# Patient Record
Sex: Female | Born: 1990 | Race: Black or African American | Hispanic: No | Marital: Single | Smoking: Never smoker
Health system: Southern US, Community
[De-identification: ages and names within clinical notes are randomized; demographics above are authoritative.]

## PROBLEM LIST (undated history)

## (undated) DIAGNOSIS — E611 Iron deficiency: Secondary | ICD-10-CM

## (undated) DIAGNOSIS — F329 Major depressive disorder, single episode, unspecified: Secondary | ICD-10-CM

## (undated) DIAGNOSIS — F419 Anxiety disorder, unspecified: Secondary | ICD-10-CM

## (undated) DIAGNOSIS — F32A Depression, unspecified: Secondary | ICD-10-CM

## (undated) DIAGNOSIS — B009 Herpesviral infection, unspecified: Secondary | ICD-10-CM

## (undated) DIAGNOSIS — Z789 Other specified health status: Secondary | ICD-10-CM

## (undated) HISTORY — PX: NO PAST SURGERIES: SHX2092

## (undated) HISTORY — DX: Depression, unspecified: F32.A

## (undated) HISTORY — DX: Herpesviral infection, unspecified: B00.9

## (undated) HISTORY — DX: Anxiety disorder, unspecified: F41.9

---
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
## 2013-12-08 ENCOUNTER — Encounter (HOSPITAL_COMMUNITY): Payer: Self-pay | Admitting: *Deleted

## 2013-12-08 ENCOUNTER — Inpatient Hospital Stay (HOSPITAL_COMMUNITY)
Admission: AD | Admit: 2013-12-08 | Discharge: 2013-12-08 | Disposition: A | Discharge status: Home / Self Care | Payer: Managed Care, Other (non HMO) | Source: Ambulatory Visit | Attending: Obstetrics and Gynecology | Admitting: Obstetrics and Gynecology

## 2013-12-08 ENCOUNTER — Inpatient Hospital Stay (HOSPITAL_COMMUNITY): Payer: Managed Care, Other (non HMO)

## 2013-12-08 DIAGNOSIS — O209 Hemorrhage in early pregnancy, unspecified: Secondary | ICD-10-CM | POA: Insufficient documentation

## 2013-12-08 DIAGNOSIS — O039 Complete or unspecified spontaneous abortion without complication: Secondary | ICD-10-CM | POA: Insufficient documentation

## 2013-12-08 DIAGNOSIS — O4690 Antepartum hemorrhage, unspecified, unspecified trimester: Secondary | ICD-10-CM

## 2013-12-08 HISTORY — DX: Other specified health status: Z78.9

## 2013-12-08 LAB — URINE MICROSCOPIC-ADD ON

## 2013-12-08 LAB — URINALYSIS, ROUTINE W REFLEX MICROSCOPIC
Bilirubin Urine: NEGATIVE
Glucose, UA: NEGATIVE mg/dL
Ketones, ur: NEGATIVE mg/dL
Leukocytes, UA: NEGATIVE
Nitrite: NEGATIVE
Protein, ur: NEGATIVE mg/dL
Specific Gravity, Urine: 1.015 (ref 1.005–1.030)
Urobilinogen, UA: 0.2 mg/dL (ref 0.0–1.0)
pH: 7.5 (ref 5.0–8.0)

## 2013-12-08 LAB — WET PREP, GENITAL
Trich, Wet Prep: NONE SEEN
Yeast Wet Prep HPF POC: NONE SEEN

## 2013-12-08 LAB — CBC
HCT: 36 % (ref 36.0–46.0)
Hemoglobin: 12 g/dL (ref 12.0–15.0)
MCH: 28.4 pg (ref 26.0–34.0)
MCHC: 33.3 g/dL (ref 30.0–36.0)
MCV: 85.1 fL (ref 78.0–100.0)
Platelets: 162 10*3/uL (ref 150–400)
RBC: 4.23 MIL/uL (ref 3.87–5.11)
RDW: 13.1 % (ref 11.5–15.5)
WBC: 5.5 10*3/uL (ref 4.0–10.5)

## 2013-12-08 LAB — HCG, QUANTITATIVE, PREGNANCY: hCG, Beta Chain, Quant, S: 1113 m[IU]/mL — ABNORMAL HIGH (ref <5)

## 2013-12-08 LAB — ABO/RH: ABO/RH(D): B POS

## 2013-12-08 LAB — POCT PREGNANCY, URINE: Preg Test, Ur: POSITIVE — AB

## 2013-12-08 NOTE — Discharge Instructions (Signed)
Vaginal Bleeding During Pregnancy, First Trimester  A small amount of bleeding (spotting) from the vagina is relatively common in early pregnancy. It usually stops on its own. Various things may cause bleeding or spotting in early pregnancy. Some bleeding may be related to the pregnancy, and some may not. In most cases, the bleeding is normal and is not a problem. However, bleeding can also be a sign of something serious. Be sure to tell your health care provider about any vaginal bleeding right away.  Some possible causes of vaginal bleeding during the first trimester include:  · Infection or inflammation of the cervix.  · Growths (polyps) on the cervix.  · Miscarriage or threatened miscarriage.  · Pregnancy tissue has developed outside of the uterus and in a fallopian tube (tubal pregnancy).  · Tiny cysts have developed in the uterus instead of pregnancy tissue (molar pregnancy).  HOME CARE INSTRUCTIONS   Watch your condition for any changes. The following actions may help to lessen any discomfort you are feeling:  · Follow your health care provider's instructions for limiting your activity. If your health care provider orders bed rest, you may need to stay in bed and only get up to use the bathroom. However, your health care provider may allow you to continue light activity.  · If needed, make plans for someone to help with your regular activities and responsibilities while you are on bed rest.  · Keep track of the number of pads you use each day, how often you change pads, and how soaked (saturated) they are. Write this down.  · Do not use tampons. Do not douche.  · Do not have sexual intercourse or orgasms until approved by your health care provider.  · If you pass any tissue from your vagina, save the tissue so you can show it to your health care provider.  · Only take over-the-counter or prescription medicines as directed by your health care provider.  · Do not take aspirin because it can make you  bleed.  · Keep all follow-up appointments as directed by your health care provider.  SEEK MEDICAL CARE IF:  · You have any vaginal bleeding during any part of your pregnancy.  · You have cramps or labor pains.  · You have a fever, not controlled by medicine.  SEEK IMMEDIATE MEDICAL CARE IF:   · You have severe cramps in your back or belly (abdomen).  · You pass large clots or tissue from your vagina.  · Your bleeding increases.  · You feel light-headed or weak, or you have fainting episodes.  · You have chills.  · You are leaking fluid or have a gush of fluid from your vagina.  · You pass out while having a bowel movement.  MAKE SURE YOU:  · Understand these instructions.  · Will watch your condition.  · Will get help right away if you are not doing well or get worse.  Document Released: 12/15/2004 Document Revised: 03/12/2013 Document Reviewed: 11/12/2012  ExitCare® Patient Information ©2015 [employer]. This information is not intended to replace advice given to you by your health care provider. Make sure you discuss any questions you have with your health care provider.

## 2013-12-08 NOTE — MAU Note (Signed)
Pt presents to MAU with complaints of heavy vaginal bleeding that started today while she was at work. She had a + HPT on September the 7th. Reports lower abdominal cramping

## 2013-12-08 NOTE — MAU Provider Note (Signed)
History     CSN: [id number]  Arrival date and time: 12/08/13 1711   First Provider Initiated Contact with Patient 12/08/13 1822      Chief Complaint  Patient presents with  . Vaginal Bleeding   HPI Kayla Murphy is a 23 y.o. G2P0010 with LMP 7/26 making her 8 1/[redacted] wks EGA by dates. She has spotting 8/25 x 2-3 days. First post UPT 9/7, had BHCG's that increased with PCP. She has had low pelvic pressure since 9/14. She started having low abd cramping at 11:30 this am, was at work. Bleeding started after that, passed a large clot in BR after arrived at hospital. She continue to cramp and bleed, not as much. No recent change in discharge, odor, itchig. No UTI S&S or GI changes. OB History   Grav Para Term Preterm Abortions TAB SAB Ect Mult Living        Past Medical History  Diagnosis Date  . Medical history non-contributory     Past Surgical History  Procedure Laterality Date  . No past surgeries      History reviewed. No pertinent family history.  History  Substance Use Topics  . Smoking status: Never Smoker   . Smokeless tobacco: Never Used  . Alcohol Use: Yes     Comment: socially    Allergies:  Allergies  Allergen Reactions  . Shellfish Allergy Anaphylaxis    Prescriptions prior to admission  Medication Sig Dispense Refill  . EPINEPHrine (EPIPEN 2-PAK) 0.3 mg/0.3 mL IJ SOAJ injection Inject 0.3 mg into the muscle once.      Marland Kitchen ibuprofen (ADVIL,MOTRIN) 800 MG tablet Take 800 mg by mouth every 8 (eight) hours as needed for moderate pain.        Review of Systems  Constitutional: Negative for fever and chills.  Gastrointestinal: Positive for abdominal pain. Negative for nausea, vomiting, diarrhea and constipation.  Genitourinary: Negative for dysuria, urgency and frequency.       Vaginal bleeding   Physical Exam   Blood pressure 128/70, pulse 76, temperature 98.7 F (37.1 C), resp. rate 18, height  (1.626 m), weight 79.833 kg (176 lb),  last menstrual period 11/07/2013.  Physical Exam  Nursing note and vitals reviewed. Constitutional: She is oriented to person, place, and time. She appears well-developed and well-nourished.  GI: Soft. There is no tenderness.  Genitourinary:  Pelvic exam: Ext gen- nl anatomy, skin intact Vagina- sm amt bright red blood/clot Cx- sl open Utrus- enlarged 6 wk size Adn- non masses palp, non tender  Musculoskeletal: Normal range of motion.  Neurological: She is alert and oriented to person, place, and time.  Skin: Skin is warm and dry.  Psychiatric: She has a normal mood and affect. Her behavior is normal.    MAU Course  Procedures  MDM Results for orders placed during the hospital encounter of 12/08/13 (from the past 24 hour(s))  URINALYSIS, ROUTINE W REFLEX MICROSCOPIC     Status: Abnormal   Collection Time    12/08/13  5:35 PM      Result Value Ref Range   Color, Urine YELLOW  YELLOW   APPearance CLEAR  CLEAR   Specific Gravity, Urine 1.015  1.005 - 1.030   pH 7.5  5.0 - 8.0   Glucose, UA NEGATIVE  NEGATIVE mg/dL   Hgb urine dipstick LARGE (*) NEGATIVE   Bilirubin Urine NEGATIVE  NEGATIVE   Ketones, ur NEGATIVE  NEGATIVE mg/dL  Protein, ur NEGATIVE  NEGATIVE mg/dL   Urobilinogen, UA 0.2  0.0 - 1.0 mg/dL   Nitrite NEGATIVE  NEGATIVE   Leukocytes, UA NEGATIVE  NEGATIVE  URINE MICROSCOPIC-ADD ON     Status: None   Collection Time    12/08/13  5:35 PM      Result Value Ref Range   Squamous Epithelial / LPF RARE  RARE   WBC, UA 0-2  <3 WBC/hpf   RBC / HPF 21-50  <3 RBC/hpf   Bacteria, UA RARE  RARE  POCT PREGNANCY, URINE     Status: Abnormal   Collection Time    12/08/13  5:49 PM      Result Value Ref Range   Preg Test, Ur POSITIVE (*) NEGATIVE  ABO/RH     Status: None   Collection Time    12/08/13  6:35 PM      Result Value Ref Range   ABO/RH(D) B POS    CBC     Status: None   Collection Time    12/08/13  6:35 PM      Result Value Ref Range   WBC 5.5  4.0 -  10.5 K/uL   RBC 4.23  3.87 - 5.11 MIL/uL   Hemoglobin 12.0  12.0 - 15.0 g/dL   HCT 16.1  09.6 - 04.5 %   MCV 85.1  78.0 - 100.0 fL   MCH 28.4  26.0 - 34.0 pg   MCHC 33.3  30.0 - 36.0 g/dL   RDW 40.9  81.1 - 91.4 %   Platelets 162  150 - 400 K/uL  HCG, QUANTITATIVE, PREGNANCY     Status: Abnormal   Collection Time    12/08/13  6:35 PM      Result Value Ref Range   hCG, Beta Chain, Quant, S 1113 (*) <5 mIU/mL  WET PREP, GENITAL     Status: Abnormal   Collection Time    12/08/13  6:36 PM      Result Value Ref Range   Yeast Wet Prep HPF POC NONE SEEN  NONE SEEN   Trich, Wet Prep NONE SEEN  NONE SEEN   Clue Cells Wet Prep HPF POC FEW (*) NONE SEEN   WBC, Wet Prep HPF POC FEW (*) NONE SEEN   US Ob Comp Less 14 Wks  12/08/2013   CLINICAL DATA:  Heavy vaginal bleeding and pelvic cramping. Pregnant. Beta HCG level 1,113.  EXAM: OBSTETRIC <14 WK [place] AND TRANSVAGINAL OB US  TECHNIQUE: Both transabdominal and transvaginal ultrasound examinations were performed for complete evaluation of the gestation as well as the maternal uterus, adnexal regions, and pelvic cul-de-sac. Transvaginal technique was performed to assess early pregnancy.  COMPARISON:  None.  FINDINGS: Intrauterine gestational sac: Oval gestational sac lies in the lower uterine segment near the endocervix.  Yolk sac:  Yes  Embryo: Focal area of soft tissue lies along side the yolk sac which may reflect an embryo.  Cardiac Activity: No  MSD:  7.8 mm  mm   5 w   3  d  CRL: Possible embryo. 4.7 mm 6 w 2 d [place] EDC: 08/01/2014 based on the possible embryo.  Maternal uterus/adnexae: No subchronic hemorrhage. No uterine mass. Cervix is closed. Ovaries are unremarkable. No free fluid.  IMPRESSION: 1. Gestational sac containing a yolk sac and a possible embryo lies in the lower uterine segment adjacent to the endocervix, can abnormally low position. No fetal heart activity could be detected. This may reflect an  early intrauterine pregnancy. It may  reflect a failed intrauterine pregnancy. Findings are suspicious but not yet definitive for failed pregnancy. Recommend follow-up US in 10-14 days for definitive diagnosis. This recommendation follows SRU consensus guidelines: Diagnostic Criteria for Nonviable Pregnancy Early in the First Trimester. Malva Limes Med 2013; 956:2130-86. 2. No subchronic hemorrhage were uterine abnormality. Cervix is closed. Normal adnexae and ovaries.   Electronically Signed   By: Amie Portland M.D.   On: 12/08/2013 20:05   US Ob Transvaginal  12/08/2013   CLINICAL DATA:  Heavy vaginal bleeding and pelvic cramping. Pregnant. Beta HCG level 1,113.  EXAM: OBSTETRIC <14 WK [place] AND TRANSVAGINAL OB US  TECHNIQUE: Both transabdominal and transvaginal ultrasound examinations were performed for complete evaluation of the gestation as well as the maternal uterus, adnexal regions, and pelvic cul-de-sac. Transvaginal technique was performed to assess early pregnancy.  COMPARISON:  None.  FINDINGS: Intrauterine gestational sac: Oval gestational sac lies in the lower uterine segment near the endocervix.  Yolk sac:  Yes  Embryo: Focal area of soft tissue lies along side the yolk sac which may reflect an embryo.  Cardiac Activity: No  MSD:  7.8 mm  mm   5 w   3  d  CRL: Possible embryo. 4.7 mm 6 w 2 d [place] EDC: 08/01/2014 based on the possible embryo.  Maternal uterus/adnexae: No subchronic hemorrhage. No uterine mass. Cervix is closed. Ovaries are unremarkable. No free fluid.  IMPRESSION: 1. Gestational sac containing a yolk sac and a possible embryo lies in the lower uterine segment adjacent to the endocervix, can abnormally low position. No fetal heart activity could be detected. This may reflect an early intrauterine pregnancy. It may reflect a failed intrauterine pregnancy. Findings are suspicious but not yet definitive for failed pregnancy. Recommend follow-up US in 10-14 days for definitive diagnosis. This recommendation follows SRU consensus  guidelines: Diagnostic Criteria for Nonviable Pregnancy Early in the First Trimester. Malva Limes Med 2013; 578:4696-29. 2. No subchronic hemorrhage were uterine abnormality. Cervix is closed. Normal adnexae and ovaries.   Electronically Signed   By: Amie Portland M.D.   On: 12/08/2013 20:05     Assessment and Plan  Early failed pregnancy vs early pregnancy Options reviewed with the pt, she will return in 48 hr for repeat BHCG Course for normal miscarriage reviewed Return sooner with heavy bleeding, fever >100.5 or dizziness/fainting Cancelled pathology on blood clot/tissue pt passed in toilet  Zylpha Poynor M. 12/08/2013, 6:42 PM

## 2013-12-09 LAB — HIV ANTIBODY (ROUTINE TESTING W REFLEX): HIV 1&2 Ab, 4th Generation: NONREACTIVE

## 2013-12-09 NOTE — MAU Provider Note (Signed)
Attestation of Attending Supervision of Advanced Practitioner (CNM/NP): Evaluation and management procedures were performed by the Advanced Practitioner under my supervision and collaboration.  I have reviewed the Advanced Practitioner's note and chart, and I agree with the management and plan.  Labron Bloodgood 12/09/2013 8:13 AM   

## 2013-12-10 ENCOUNTER — Inpatient Hospital Stay (HOSPITAL_COMMUNITY)
Admission: AD | Admit: 2013-12-10 | Discharge: 2013-12-10 | Disposition: A | Discharge status: Home / Self Care | Payer: Managed Care, Other (non HMO) | Source: Ambulatory Visit | Attending: Obstetrics & Gynecology | Admitting: Obstetrics & Gynecology

## 2013-12-10 ENCOUNTER — Encounter (HOSPITAL_COMMUNITY): Payer: Self-pay

## 2013-12-10 ENCOUNTER — Telehealth: Payer: Self-pay

## 2013-12-10 DIAGNOSIS — O034 Incomplete spontaneous abortion without complication: Secondary | ICD-10-CM | POA: Diagnosis not present

## 2013-12-10 DIAGNOSIS — A749 Chlamydial infection, unspecified: Secondary | ICD-10-CM

## 2013-12-10 DIAGNOSIS — O039 Complete or unspecified spontaneous abortion without complication: Secondary | ICD-10-CM

## 2013-12-10 DIAGNOSIS — O209 Hemorrhage in early pregnancy, unspecified: Secondary | ICD-10-CM | POA: Diagnosis present

## 2013-12-10 LAB — GC/CHLAMYDIA PROBE AMP
CT Probe RNA: POSITIVE — AB
GC Probe RNA: NEGATIVE

## 2013-12-10 LAB — HCG, QUANTITATIVE, PREGNANCY: hCG, Beta Chain, Quant, S: 1012 m[IU]/mL — ABNORMAL HIGH (ref <5)

## 2013-12-10 MED ORDER — PROMETHAZINE HCL 25 MG PO TABS: 25.0000 mg | ORAL_TABLET | Freq: Four times a day (QID) | ORAL | Status: DC | PRN

## 2013-12-10 MED ORDER — IBUPROFEN 800 MG PO TABS: 800.0000 mg | ORAL_TABLET | Freq: Three times a day (TID) | ORAL | Status: DC

## 2013-12-10 MED ORDER — AZITHROMYCIN 500 MG PO TABS: 1000.0000 mg | ORAL_TABLET | Freq: Once | ORAL | Status: DC

## 2013-12-10 MED ORDER — MISOPROSTOL 200 MCG PO TABS: 800.0000 ug | ORAL_TABLET | Freq: Once | ORAL | Status: DC

## 2013-12-10 MED ORDER — ONDANSETRON 8 MG PO TBDP: 8.0000 mg | ORAL_TABLET | Freq: Three times a day (TID) | ORAL | Status: DC | PRN

## 2013-12-10 NOTE — MAU Provider Note (Signed)
History     CSN: [id number]  Arrival date and time: 12/10/13 1623   None     Chief Complaint  Patient presents with  . Follow-up   HPIpt is G3P0010 at [redacted]w[redacted]d pregnant for f/u HCG.  Pt was seen on 9/20 with spotting in pregnancy.  Pt had a positive UPT 11/25/2013 and had rising HCGs with PCP then started having low abd pressure on 9/14 and cramping on 9/20 followed by vaginal bleeding with large clot.  Pt's HCG on 9/20  Was 1113 with ultrasound showing IUGS with YS and possible embryo in lower uterine segment. Pt was told this was likely a failed pregnancy. Pt is in school and also works. Pt continues to have abd cramping and mod bleeding, but no passage of tissue or more clots.  RN note: Patient to MAU for repeat BHCG. Patient states she is having mild abdominal and back pain and patient states moderate bleeding.      Past Medical History  Diagnosis Date  . Medical history non-contributory     Past Surgical History  Procedure Laterality Date  . No past surgeries      No family history on file.  History  Substance Use Topics  . Smoking status: Never Smoker   . Smokeless tobacco: Never Used  . Alcohol Use: Yes     Comment: socially    Allergies:  Allergies  Allergen Reactions  . Shellfish Allergy Anaphylaxis    Prescriptions prior to admission  Medication Sig Dispense Refill  . azithromycin (ZITHROMAX) 500 MG tablet Take 2 tablets (1,000 mg total) by mouth once.  2 tablet  0  . EPINEPHrine (EPIPEN 2-PAK) 0.3 mg/0.3 mL IJ SOAJ injection Inject 0.3 mg into the muscle once.        Review of Systems  Constitutional: Positive for malaise/fatigue. Negative for fever and chills.  Gastrointestinal: Positive for nausea and abdominal pain. Negative for vomiting, diarrhea and constipation.  Genitourinary: Negative for dysuria.   Physical Exam   Blood pressure 113/63, pulse 67, temperature 99.3 F (37.4 C), resp. rate 16, last menstrual period 11/07/2013, SpO2  100.00%.  Physical Exam  Nursing note and vitals reviewed. Constitutional: She is oriented to person, place, and time. She appears well-developed and well-nourished. No distress.  HENT:  Head: Normocephalic.  Eyes: Pupils are equal, round, and reactive to light.  Neck: Normal range of motion. Neck supple.  Cardiovascular: Normal rate.   Respiratory: Effort normal.  GI: Soft.  Musculoskeletal: Normal range of motion.  Neurological: She is alert and oriented to person, place, and time.  Skin: Skin is warm and dry.  Psychiatric: She has a normal mood and affect.    MAU Course  Procedures Results for orders placed during the hospital encounter of 12/10/13 (from the past 24 hour(s))  HCG, QUANTITATIVE, PREGNANCY     Status: Abnormal   Collection Time    12/10/13  4:57 PM      Result Value Ref Range   hCG, Beta Chain, Quant, S 1012 (*) <5 mIU/mL  discussed with pt failed pregnancy with dropping HCG Discussed options with pt including expectant management and Cytotec Pt would like to proceed with Cytotec due to unpredictable prolonged bleeding and cramping Directions with side effects and bleeding discussed with pt- written instructions given Pt's Blood Type is B POS Hemoglobin 12 gm on 12/08/2012  Assessment and Plan  Incomplete miscarriage cytotec 800 mcg - phenergan  Rx and Ibuprofen  Rx. F/u in 2 weeks with clinic- message  sent to [facility], to make appointment  [facility] 12/10/2013, 5:43 PM

## 2013-12-10 NOTE — Telephone Encounter (Signed)
Called patient and informed her of results and recommendations. Zithromax 1gm e-prescribed to pharmacy. Informed her that she should abstain from sex for at least 7 days after both she and partner are treated. Patient verbalized understanding and gratitude. No questions or concers. STD card faxed to [facility].

## 2013-12-10 NOTE — Telephone Encounter (Signed)
Message copied by Laraine Samet M on Tue Dec 10, 2013  9:07 AM ------      Message from: CONSTANT, PEGGY      Created: Tue Dec 10, 2013  8:50 AM       Please inform patient of positive chlamydia infection. Please call in Azithromycin 1 gm po x 1 for her and inform her to have partner treated as well            Thanks            Peggy ------ 

## 2013-12-10 NOTE — Telephone Encounter (Signed)
Message copied by Louanna Raw on Tue Dec 10, 2013  9:07 AM ------      Message from: Catalina Antigua      Created: Tue Dec 10, 2013  8:50 AM       Please inform patient of positive chlamydia infection. Please call in Azithromycin 1 gm po x 1 for her and inform her to have partner treated as well            Thanks            Peggy ------

## 2013-12-10 NOTE — MAU Note (Signed)
Patient to MAU for repeat BHCG. Patient states she is having mild abdominal and back pain and patient states moderate bleeding.

## 2013-12-25 ENCOUNTER — Ambulatory Visit (INDEPENDENT_AMBULATORY_CARE_PROVIDER_SITE_OTHER): Payer: Managed Care, Other (non HMO) | Admitting: Obstetrics and Gynecology

## 2013-12-25 ENCOUNTER — Encounter: Payer: Self-pay | Admitting: Obstetrics and Gynecology

## 2013-12-25 VITALS — BP 106/63 | HR 69 | Temp 98.3°F | Ht 65.0 in | Wt 172.6 lb

## 2013-12-25 DIAGNOSIS — O039 Complete or unspecified spontaneous abortion without complication: Secondary | ICD-10-CM

## 2013-12-25 DIAGNOSIS — Z3009 Encounter for other general counseling and advice on contraception: Secondary | ICD-10-CM

## 2013-12-25 NOTE — Progress Notes (Signed)
Patient ID: Kayla Murphy, female   DOB: 1991-01-15, 23 y.o.   MRN: [id number] 23 yo G3P0030 presenting as an MAU follow up s/p medical management of a spontaneous abortion with cytotec. Patient reports vaginal bleeding for approximately 10 days with initial passage of large blood clots. Patient denies CP, SOB, lightheadness/dizziness, nausea or emesis. This was an unplanned pregnancy and the patient is interested in contraception  Past Medical History  Diagnosis Date  . Medical history non-contributory    Past Surgical History  Procedure Laterality Date  . No past surgeries     No family history on file.  History  Substance Use Topics  . Smoking status: Never Smoker   . Smokeless tobacco: Never Used  . Alcohol Use: Yes     Comment: socially   Physical exam Not indicated  A/P 23 yo s/p cytotec administration on 9/22 for SAB - Will repeat quant HCG - Extensive contraception counseling. After careful consideration, patient is interested in Nexplanon - Patient advised to abstain from unprotected intercourse until Nexplanon placement - RTC in 1 week for Nexplanon insertion or prn

## 2013-12-25 NOTE — Patient Instructions (Signed)
Etonogestrel implant What is this medicine? ETONOGESTREL (et oh noe JES trel) is a contraceptive (birth control) device. It is used to prevent pregnancy. It can be used for up to 3 years. This medicine may be used for other purposes; ask your health care provider or pharmacist if you have questions. COMMON BRAND NAME(S): Implanon, Nexplanon What should I tell my health care provider before I take this medicine? They need to know if you have any of these conditions: -abnormal vaginal bleeding -blood vessel disease or blood clots -cancer of the breast, cervix, or liver -depression -diabetes -gallbladder disease -headaches -heart disease or recent heart attack -high blood pressure -high cholesterol -kidney disease -liver disease -renal disease -seizures -tobacco smoker -an unusual or allergic reaction to etonogestrel, other hormones, anesthetics or antiseptics, medicines, foods, dyes, or preservatives -pregnant or trying to get pregnant -breast-feeding How should I use this medicine? This device is inserted just under the skin on the inner side of your upper arm by a health care professional. Talk to your pediatrician regarding the use of this medicine in children. Special care may be needed. Overdosage: If you think you've taken too much of this medicine contact a poison control center or emergency room at once. Overdosage: If you think you have taken too much of this medicine contact a poison control center or emergency room at once. NOTE: This medicine is only for you. Do not share this medicine with others. What if I miss a dose? This does not apply. What may interact with this medicine? Do not take this medicine with any of the following medications: -amprenavir -bosentan -fosamprenavir This medicine may also interact with the following medications: -barbiturate medicines for inducing sleep or treating seizures -certain medicines for fungal infections like ketoconazole and  itraconazole -griseofulvin -medicines to treat seizures like carbamazepine, felbamate, oxcarbazepine, phenytoin, topiramate -modafinil -phenylbutazone -rifampin -some medicines to treat HIV infection like atazanavir, indinavir, lopinavir, nelfinavir, tipranavir, ritonavir -St. John's wort This list may not describe all possible interactions. Give your health care provider a list of all the medicines, herbs, non-prescription drugs, or dietary supplements you use. Also tell them if you smoke, drink alcohol, or use illegal drugs. Some items may interact with your medicine. What should I watch for while using this medicine? This product does not protect you against HIV infection (AIDS) or other sexually transmitted diseases. You should be able to feel the implant by pressing your fingertips over the skin where it was inserted. Tell your doctor if you cannot feel the implant. What side effects may I notice from receiving this medicine? Side effects that you should report to your doctor or health care professional as soon as possible: -allergic reactions like skin rash, itching or hives, swelling of the face, lips, or tongue -breast lumps -changes in vision -confusion, trouble speaking or understanding -dark urine -depressed mood -general ill feeling or flu-like symptoms -light-colored stools -loss of appetite, nausea -right upper belly pain -severe headaches -severe pain, swelling, or tenderness in the abdomen -shortness of breath, chest pain, swelling in a leg -signs of pregnancy -sudden numbness or weakness of the face, arm or leg -trouble walking, dizziness, loss of balance or coordination -unusual vaginal bleeding, discharge -unusually weak or tired -yellowing of the eyes or skin Side effects that usually do not require medical attention (Report these to your doctor or health care professional if they continue or are bothersome.): -acne -breast pain -changes in  weight -cough -fever or chills -headache -irregular menstrual bleeding -itching, burning, and   vaginal discharge -pain or difficulty passing urine -sore throat This list may not describe all possible side effects. Call your doctor for medical advice about side effects. You may report side effects to FDA at 1-800-FDA-1088. Where should I keep my medicine? This drug is given in a hospital or clinic and will not be stored at home. NOTE: This sheet is a summary. It may not cover all possible information. If you have questions about this medicine, talk to your doctor, pharmacist, or health care provider.  2015, Elsevier/Gold Standard. (2011-09-12 15:37:45)  

## 2013-12-26 LAB — HCG, QUANTITATIVE, PREGNANCY: hCG, Beta Chain, Quant, S: <2 m[IU]/mL

## 2013-12-27 ENCOUNTER — Telehealth: Payer: Self-pay

## 2013-12-27 NOTE — Telephone Encounter (Signed)
Message copied by Louanna RawAMPBELL, Marua Qin M on Fri Dec 27, 2013 11:54 AM ------      Message from: Catalina AntiguaONSTANT, PEGGY      Created: Fri Dec 27, 2013  8:26 AM       Please inform patient of complete resolution of pregnancy. Patient should abstain or use condoms until Nexplanon insertion ------

## 2013-12-27 NOTE — Telephone Encounter (Signed)
Called patient and informed her of results and recommendations. Patient verbalized understanding and gratitude. No questions or concerns.  

## 2014-01-21 ENCOUNTER — Encounter: Payer: Self-pay | Admitting: Obstetrics and Gynecology

## 2014-01-31 ENCOUNTER — Ambulatory Visit: Payer: Managed Care, Other (non HMO) | Admitting: Family Medicine

## 2014-04-15 ENCOUNTER — Encounter (HOSPITAL_COMMUNITY): Payer: Self-pay | Admitting: Emergency Medicine

## 2014-04-15 ENCOUNTER — Emergency Department (INDEPENDENT_AMBULATORY_CARE_PROVIDER_SITE_OTHER)
Admission: EM | Admit: 2014-04-15 | Discharge: 2014-04-15 | Disposition: A | Discharge status: Home / Self Care | Payer: Self-pay | Source: Home / Self Care | Attending: Family Medicine | Admitting: Family Medicine

## 2014-04-15 ENCOUNTER — Emergency Department (INDEPENDENT_AMBULATORY_CARE_PROVIDER_SITE_OTHER): Payer: Managed Care, Other (non HMO)

## 2014-04-15 DIAGNOSIS — M791 Myalgia: Secondary | ICD-10-CM

## 2014-04-15 DIAGNOSIS — M7918 Myalgia, other site: Secondary | ICD-10-CM

## 2014-04-15 MED ORDER — DICLOFENAC SODIUM 75 MG PO TBEC: 75.0000 mg | DELAYED_RELEASE_TABLET | Freq: Two times a day (BID) | ORAL | Status: DC

## 2014-04-15 MED ORDER — METHOCARBAMOL 500 MG PO TABS: 500.0000 mg | ORAL_TABLET | Freq: Four times a day (QID) | ORAL | Status: DC | PRN
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
## 2018-03-14 ENCOUNTER — Other Ambulatory Visit: Payer: Self-pay

## 2018-03-14 ENCOUNTER — Emergency Department (HOSPITAL_COMMUNITY)
Admission: EM | Admit: 2018-03-14 | Discharge: 2018-03-14 | Disposition: A | Discharge status: Home / Self Care | Payer: Self-pay | Attending: Emergency Medicine | Admitting: Emergency Medicine

## 2018-03-14 ENCOUNTER — Emergency Department (HOSPITAL_COMMUNITY): Payer: Self-pay

## 2018-03-14 ENCOUNTER — Encounter (HOSPITAL_COMMUNITY): Payer: Self-pay | Admitting: Emergency Medicine

## 2018-03-14 DIAGNOSIS — J101 Influenza due to other identified influenza virus with other respiratory manifestations: Secondary | ICD-10-CM

## 2018-03-14 DIAGNOSIS — J111 Influenza due to unidentified influenza virus with other respiratory manifestations: Secondary | ICD-10-CM | POA: Insufficient documentation

## 2018-03-14 LAB — CBC WITH DIFFERENTIAL/PLATELET
Abs Immature Granulocytes: 0.01 10*3/uL (ref 0.00–0.07)
Basophils Absolute: 0 10*3/uL (ref 0.0–0.1)
Basophils Relative: 0 %
Eosinophils Absolute: 0 10*3/uL (ref 0.0–0.5)
Eosinophils Relative: 1 %
HCT: 40.7 % (ref 36.0–46.0)
Hemoglobin: 12.6 g/dL (ref 12.0–15.0)
Immature Granulocytes: 0 %
Lymphocytes Relative: 24 %
Lymphs Abs: 0.7 10*3/uL (ref 0.7–4.0)
MCH: 26.4 pg (ref 26.0–34.0)
MCHC: 31 g/dL (ref 30.0–36.0)
MCV: 85.3 fL (ref 80.0–100.0)
Monocytes Absolute: 0.4 10*3/uL (ref 0.1–1.0)
Monocytes Relative: 14 %
Neutro Abs: 1.8 10*3/uL (ref 1.7–7.7)
Neutrophils Relative %: 61 %
Platelets: 124 10*3/uL — ABNORMAL LOW (ref 150–400)
RBC: 4.77 MIL/uL (ref 3.87–5.11)
RDW: 13.2 % (ref 11.5–15.5)
WBC: 3 10*3/uL — ABNORMAL LOW (ref 4.0–10.5)
nRBC: 0 % (ref 0.0–0.2)

## 2018-03-14 LAB — URINALYSIS, ROUTINE W REFLEX MICROSCOPIC
Bacteria, UA: NONE SEEN
Bilirubin Urine: NEGATIVE
Glucose, UA: NEGATIVE mg/dL
Hgb urine dipstick: NEGATIVE
Ketones, ur: 5 mg/dL — AB
Nitrite: NEGATIVE
Protein, ur: NEGATIVE mg/dL
Specific Gravity, Urine: 1.021 (ref 1.005–1.030)
pH: 5 (ref 5.0–8.0)

## 2018-03-14 LAB — PREGNANCY, URINE: Preg Test, Ur: NEGATIVE

## 2018-03-14 LAB — BASIC METABOLIC PANEL
Anion gap: 10 (ref 5–15)
BUN: 6 mg/dL (ref 6–20)
CO2: 20 mmol/L — ABNORMAL LOW (ref 22–32)
Calcium: 9 mg/dL (ref 8.9–10.3)
Chloride: 106 mmol/L (ref 98–111)
Creatinine, Ser: 0.85 mg/dL (ref 0.44–1.00)
GFR calc Af Amer: >60 mL/min (ref >60)
GFR calc non Af Amer: >60 mL/min (ref >60)
Glucose, Bld: 80 mg/dL (ref 70–99)
Potassium: 3.5 mmol/L (ref 3.5–5.1)
Sodium: 136 mmol/L (ref 135–145)

## 2018-03-14 LAB — INFLUENZA PANEL BY PCR (TYPE A & B)
Influenza A By PCR: NEGATIVE
Influenza B By PCR: POSITIVE — AB

## 2018-03-14 MED ORDER — KETOROLAC TROMETHAMINE 15 MG/ML IJ SOLN
15.0000 mg | Freq: Once | INTRAMUSCULAR | Status: AC
  Administered 2018-03-14: 15 mg via INTRAVENOUS
  Filled 2018-03-14: qty 1

## 2018-03-14 MED ORDER — SODIUM CHLORIDE 0.9 % IV BOLUS
1000.0000 mL | Freq: Once | INTRAVENOUS | Status: AC
  Administered 2018-03-14: 1000 mL via INTRAVENOUS

## 2018-03-14 NOTE — ED Triage Notes (Signed)
Patient from home, since Monday patient started having fevers of 100.9, fever got up to 102 today. Patient having n/v, denies diarrhea. Also reports she has been "peeing on herself" which is not normal for her. Patient has been taking Equate multi symptom and tylenol extra strength PTA.

## 2018-03-14 NOTE — ED Notes (Signed)
IV team at bedside 

## 2018-03-14 NOTE — Discharge Instructions (Signed)
Please rest and drink plenty of fluids. Take Tylenol or Motrin for pain/fever Return to the ED for worsening symptoms

## 2018-03-14 NOTE — ED Notes (Signed)
Pt back from X-ray.  

## 2018-03-14 NOTE — ED Provider Notes (Signed)
[facility] Provider Note   CSN: [id number] Arrival date & time: 03/14/18  1641     History   Chief Complaint Chief Complaint  Patient presents with  . Fever    HPI Kayla Murphy is a 27 y.o. female who presents with fever, URI symptoms, vomiting.  No significant past medical history.  The patient states that for the past 3 days she has had intermittent fevers, congestion, generalized body aches, headache, sore throat, runny nose, coughing.  She also has chest tightness and SOB. Yesterday she had several episodes of nausea and vomiting.  She has not had any any of that today.  She denies abdominal pain, diarrhea, constipation, urinary symptoms.  She does note that she urinated on herself after coughing.  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  She is not had a flu shot this year.  HPI  Past Medical History:  Diagnosis Date  . Medical history non-contributory     There are no active problems to display for this patient.   Past Surgical History:  Procedure Laterality Date  . NO PAST SURGERIES       OB History    Gravida  3   Para  0   Term  0   Preterm  0   AB  2   Living  0     SAB  1   TAB  1   Ectopic  0   Multiple  0   Live Births               Home Medications    Prior to Admission medications   Medication Sig Start Date End Date Taking? Authorizing Provider  acetaminophen (TYLENOL) 500 MG tablet Take 500 mg by mouth every 6 (six) hours as needed for mild pain or fever.   Yes [provider]  Acetaminophen-DM (COLD & COUGH DAYTIME PO) Take 2 tablets by mouth every 8 (eight) hours as needed (cold and cough).   Yes [provider]  EPINEPHrine (EPIPEN 2-PAK) 0.3 mg/0.3 mL IJ SOAJ injection Inject 0.3 mg into the muscle once.    [provider]    Family History History reviewed. No pertinent family history.  Social History Social History   Tobacco  Use  . Smoking status: Never Smoker  . Smokeless tobacco: Never Used  Substance Use Topics  . Alcohol use: Yes    Comment: socially  . Drug use: No     Allergies   Shellfish allergy   Review of Systems Review of Systems  Constitutional: Positive for fatigue and fever.  HENT: Positive for congestion, rhinorrhea and sore throat. Negative for ear pain and trouble swallowing.   Respiratory: Positive for cough, chest tightness and shortness of breath.   Cardiovascular: Negative for chest pain.  Gastrointestinal: Positive for nausea and vomiting. Negative for abdominal pain, constipation and diarrhea.  Genitourinary: Negative for difficulty urinating, dysuria, frequency and pelvic pain.  Neurological: Positive for headaches.  All other systems reviewed and are negative.    Physical Exam Updated Vital Signs BP 91/69 (BP Location: Right Arm)   Pulse 90   Temp 98.7 F (37.1 C) (Oral)   Resp 18   Ht 5\' 6"  (1.676 m)   Wt 69.9 kg   LMP 02/21/2018 (LMP Unknown)   SpO2 100%   BMI 24.86 kg/m   Physical Exam Vitals signs and nursing note reviewed.  Constitutional:      General: She  is not in acute distress.    Appearance: Normal appearance. She is well-developed.     Comments: Calm, cooperative. Fatigued appearing  HENT:     Head: Normocephalic and atraumatic.     Right Ear: Hearing, tympanic membrane, ear canal and external ear normal.     Left Ear: Hearing, tympanic membrane, ear canal and external ear normal.     Nose: Nose normal.     Mouth/Throat:     Lips: Pink.     Mouth: Mucous membranes are moist.     Pharynx: Oropharynx is clear.  Eyes:     General: No scleral icterus.       Right eye: No discharge.        Left eye: No discharge.     Conjunctiva/sclera: Conjunctivae normal.     Pupils: Pupils are equal, round, and reactive to light.  Neck:     Musculoskeletal: Normal range of motion.  Cardiovascular:     Rate and Rhythm: Regular rhythm. Tachycardia present.    Pulmonary:     Effort: Pulmonary effort is normal. No respiratory distress.     Breath sounds: Normal breath sounds.  Abdominal:     General: Abdomen is flat. There is no distension.     Palpations: Abdomen is soft.     Tenderness: There is no abdominal tenderness.  Skin:    General: Skin is warm and dry.  Neurological:     Mental Status: She is alert and oriented to person, place, and time.  Psychiatric:        Behavior: Behavior normal.      ED Treatments / Results  Labs (all labs ordered are listed, but only abnormal results are displayed) Labs Reviewed  URINALYSIS, ROUTINE W REFLEX MICROSCOPIC - Abnormal; Notable for the following components:      Result Value   Ketones, ur 5 (*)    Leukocytes, UA TRACE (*)    All other components within normal limits  BASIC METABOLIC PANEL - Abnormal; Notable for the following components:   CO2 20 (*)    All other components within normal limits  CBC WITH DIFFERENTIAL/PLATELET - Abnormal; Notable for the following components:   WBC 3.0 (*)    Platelets 124 (*)    All other components within normal limits  INFLUENZA PANEL BY PCR (TYPE A & B) - Abnormal; Notable for the following components:   Influenza B By PCR POSITIVE (*)    All other components within normal limits  PREGNANCY, URINE    EKG None  Radiology Dg Chest 2 View  Result Date: 03/14/2018 CLINICAL DATA:  Cough and fever for several days. EXAM: CHEST - 2 VIEW COMPARISON:  None. FINDINGS: The heart size and mediastinal contours are within normal limits. Both lungs are clear. The visualized skeletal structures are unremarkable. IMPRESSION: Negative no active cardiopulmonary disease. Electronically Signed   By: Myles Rosenthal M.D.   On: 03/14/2018 17:57    Procedures Procedures (including critical care time)  Medications Ordered in ED Medications  sodium chloride 0.9 % bolus 1,000 mL (0 mLs Intravenous Stopped 03/14/18 2049)  ketorolac (TORADOL) 15 MG/ML injection 15 mg  (15 mg Intravenous Given 03/14/18 1914)     Initial Impression / Assessment and Plan / ED Course  I have reviewed the triage vital signs and the nursing notes.  Pertinent labs & imaging results that were available during my care of the patient were reviewed by me and considered in my medical decision making (see chart for  details).  27 year old female presents with URI symptoms with vomiting.  Her vital signs are normal.  She is mildly ill-appearing.  She has multiple symptoms and also reports chest pain, vomiting, and an episode of urinary incontinence.  Will obtain blood work, chest x-ray, UA and flu swab.  Will give fluids and Toradol.  Chest x-ray is negative.  Flu is positive for influenza B.  The rest of the blood work is unremarkable.  She was advised to rest, hydrate and to take over-the-counter medicines.  She was advised to return if worsening.  Final Clinical Impressions(s) / ED Diagnoses   Final diagnoses:  Influenza B    ED Discharge Orders    None       Bethel BornGekas, Parker Sawatzky Marie, PA-C 03/14/18 2058    Tilden Fossaees, Elizabeth, MD 03/15/18 [phone]

## 2018-03-14 NOTE — ED Notes (Addendum)
Unsuccessful IV attempt.One lavender tube collected. IV consult placed.  Pt reminded of the need for urine.

## 2018-06-16 ENCOUNTER — Other Ambulatory Visit: Payer: Self-pay

## 2018-06-16 ENCOUNTER — Emergency Department (HOSPITAL_COMMUNITY)
Admission: EM | Admit: 2018-06-16 | Discharge: 2018-06-17 | Disposition: A | Discharge status: Home / Self Care | Payer: Self-pay | Attending: Emergency Medicine | Admitting: Emergency Medicine

## 2018-06-16 DIAGNOSIS — R1084 Generalized abdominal pain: Secondary | ICD-10-CM

## 2018-06-16 DIAGNOSIS — R112 Nausea with vomiting, unspecified: Secondary | ICD-10-CM

## 2018-06-16 DIAGNOSIS — R1031 Right lower quadrant pain: Secondary | ICD-10-CM | POA: Insufficient documentation

## 2018-06-16 LAB — URINALYSIS, ROUTINE W REFLEX MICROSCOPIC
Bacteria, UA: NONE SEEN
Bilirubin Urine: NEGATIVE
Glucose, UA: NEGATIVE mg/dL
Hgb urine dipstick: NEGATIVE
Ketones, ur: NEGATIVE mg/dL
Nitrite: NEGATIVE
Protein, ur: NEGATIVE mg/dL
Specific Gravity, Urine: 1.012 (ref 1.005–1.030)
pH: 6 (ref 5.0–8.0)

## 2018-06-16 LAB — CBC WITH DIFFERENTIAL/PLATELET
Abs Immature Granulocytes: 0.03 10*3/uL (ref 0.00–0.07)
Basophils Absolute: 0 10*3/uL (ref 0.0–0.1)
Basophils Relative: 0 %
Eosinophils Absolute: 0.1 10*3/uL (ref 0.0–0.5)
Eosinophils Relative: 1 %
HCT: 34.5 % — ABNORMAL LOW (ref 36.0–46.0)
Hemoglobin: 10.8 g/dL — ABNORMAL LOW (ref 12.0–15.0)
Immature Granulocytes: 0 %
Lymphocytes Relative: 12 %
Lymphs Abs: 1.1 10*3/uL (ref 0.7–4.0)
MCH: 27.3 pg (ref 26.0–34.0)
MCHC: 31.3 g/dL (ref 30.0–36.0)
MCV: 87.3 fL (ref 80.0–100.0)
Monocytes Absolute: 0.5 10*3/uL (ref 0.1–1.0)
Monocytes Relative: 6 %
Neutro Abs: 7.5 10*3/uL (ref 1.7–7.7)
Neutrophils Relative %: 81 %
Platelets: 157 10*3/uL (ref 150–400)
RBC: 3.95 MIL/uL (ref 3.87–5.11)
RDW: 13.5 % (ref 11.5–15.5)
WBC: 9.2 10*3/uL (ref 4.0–10.5)
nRBC: 0 % (ref 0.0–0.2)

## 2018-06-16 LAB — COMPREHENSIVE METABOLIC PANEL
ALT: 15 U/L (ref 0–44)
AST: 22 U/L (ref 15–41)
Albumin: 3.6 g/dL (ref 3.5–5.0)
Alkaline Phosphatase: 53 U/L (ref 38–126)
Anion gap: 8 (ref 5–15)
BUN: 7 mg/dL (ref 6–20)
CO2: 24 mmol/L (ref 22–32)
Calcium: 8.6 mg/dL — ABNORMAL LOW (ref 8.9–10.3)
Chloride: 102 mmol/L (ref 98–111)
Creatinine, Ser: 0.81 mg/dL (ref 0.44–1.00)
GFR calc Af Amer: >60 mL/min (ref >60)
GFR calc non Af Amer: >60 mL/min (ref >60)
Glucose, Bld: 99 mg/dL (ref 70–99)
Potassium: 3.1 mmol/L — ABNORMAL LOW (ref 3.5–5.1)
Sodium: 134 mmol/L — ABNORMAL LOW (ref 135–145)
Total Bilirubin: 1.1 mg/dL (ref 0.3–1.2)
Total Protein: 6.7 g/dL (ref 6.5–8.1)

## 2018-06-16 LAB — LIPASE, BLOOD: Lipase: 24 U/L (ref 11–51)

## 2018-06-16 LAB — PREGNANCY, URINE: Preg Test, Ur: NEGATIVE

## 2018-06-16 MED ORDER — SODIUM CHLORIDE 0.9 % IV BOLUS
1000.0000 mL | Freq: Once | INTRAVENOUS | Status: AC
  Administered 2018-06-16: 1000 mL via INTRAVENOUS

## 2018-06-16 MED ORDER — ONDANSETRON HCL 4 MG/2ML IJ SOLN
4.0000 mg | Freq: Once | INTRAMUSCULAR | Status: AC
  Administered 2018-06-16: 4 mg via INTRAVENOUS
  Filled 2018-06-16: qty 2

## 2018-06-16 MED ORDER — MORPHINE SULFATE (PF) 4 MG/ML IV SOLN
4.0000 mg | Freq: Once | INTRAVENOUS | Status: AC
  Administered 2018-06-16: 4 mg via INTRAVENOUS
  Filled 2018-06-16: qty 1

## 2018-06-16 NOTE — ED Provider Notes (Signed)
[facility] EMERGENCY DEPARTMENT Provider Note   CSN: [id number] Arrival date & time: 06/16/18  2157    History   Chief Complaint Chief Complaint  Patient presents with  . Abdominal Pain    HPI Kayla Murphy is a 28 y.o. female.     Pt presents to the ED today with abdominal pain, n/v, and fever.  She said she has been having sx since yesterday.  Fever was yesterday.  No fever today.  She has tried several otc meds without improvement in abd pain.  She denies any known sick contacts.     Past Medical History:  Diagnosis Date  . Medical history non-contributory     There are no active problems to display for this patient.   Past Surgical History:  Procedure Laterality Date  . NO PAST SURGERIES       OB History    Gravida  3   Para  0   Term  0   Preterm  0   AB  2   Living  0     SAB  1   TAB  1   Ectopic  0   Multiple  0   Live Births               Home Medications    Prior to Admission medications   Medication Sig Start Date End Date Taking? Authorizing Provider  acetaminophen (TYLENOL) 500 MG tablet Take 500 mg by mouth every 6 (six) hours as needed for mild pain or fever.    [provider]  Acetaminophen-DM (COLD & COUGH DAYTIME PO) Take 2 tablets by mouth every 8 (eight) hours as needed (cold and cough).    [provider]  EPINEPHrine (EPIPEN 2-PAK) 0.3 mg/0.3 mL IJ SOAJ injection Inject 0.3 mg into the muscle once.    [provider]    Family History No family history on file.  Social History Social History   Tobacco Use  . Smoking status: Never Smoker  . Smokeless tobacco: Never Used  Substance Use Topics  . Alcohol use: Yes    Comment: socially  . Drug use: No     Allergies   Shellfish allergy   Review of Systems Review of Systems  Gastrointestinal: Positive for abdominal pain, nausea and vomiting.  All other systems reviewed and are negative.    Physical Exam  Updated Vital Signs BP 117/74 (BP Location: Right Arm)   Pulse 86   Temp 97.6 F (36.4 C) (Oral)   Resp 20   SpO2 99%   Physical Exam Vitals signs and nursing note reviewed.  Constitutional:      Appearance: She is well-developed.  HENT:     Head: Normocephalic and atraumatic.     Mouth/Throat:     Mouth: Mucous membranes are moist.     Pharynx: Oropharynx is clear.  Eyes:     Extraocular Movements: Extraocular movements intact.     Pupils: Pupils are equal, round, and reactive to light.  Cardiovascular:     Rate and Rhythm: Normal rate and regular rhythm.     Heart sounds: Normal heart sounds.  Pulmonary:     Effort: Pulmonary effort is normal.     Breath sounds: Normal breath sounds.  Abdominal:     General: Abdomen is flat and scaphoid. Bowel sounds are normal.     Palpations: Abdomen is soft.     Tenderness: There is generalized abdominal tenderness and tenderness in the right lower  quadrant.  Skin:    General: Skin is warm and dry.     Capillary Refill: Capillary refill takes less than 2 seconds.  Neurological:     General: No focal deficit present.     Mental Status: She is alert and oriented to person, place, and time.  Psychiatric:        Mood and Affect: Mood normal.        Behavior: Behavior normal.      ED Treatments / Results  Labs (all labs ordered are listed, but only abnormal results are displayed) Labs Reviewed  COMPREHENSIVE METABOLIC PANEL - Abnormal; Notable for the following components:      Result Value   Sodium 134 (*)    Potassium 3.1 (*)    Calcium 8.6 (*)    All other components within normal limits  CBC WITH DIFFERENTIAL/PLATELET - Abnormal; Notable for the following components:   Hemoglobin 10.8 (*)    HCT 34.5 (*)    All other components within normal limits  URINALYSIS, ROUTINE W REFLEX MICROSCOPIC - Abnormal; Notable for the following components:   Leukocytes,Ua SMALL (*)    All other components within normal limits   PREGNANCY, URINE  LIPASE, BLOOD    EKG None  Radiology No results found.  Procedures Procedures (including critical care time)  Medications Ordered in ED Medications  sodium chloride 0.9 % bolus 1,000 mL (1,000 mLs Intravenous New Bag/Given 06/16/18 2231)  morphine 4 MG/ML injection 4 mg (4 mg Intravenous Given 06/16/18 2232)  ondansetron (ZOFRAN) injection 4 mg (4 mg Intravenous Given 06/16/18 2222)     Initial Impression / Assessment and Plan / ED Course  I have reviewed the triage vital signs and the nursing notes.  Pertinent labs & imaging results that were available during my care of the patient were reviewed by me and considered in my medical decision making (see chart for details).       CT scan abd/pelvis pending at shift change.  Pt signed out to Dr. Elesa Massed pending result.  Final Clinical Impressions(s) / ED Diagnoses   Final diagnoses:  Generalized abdominal pain    ED Discharge Orders    None       Jacalyn Lefevre, MD 06/16/18 2352

## 2018-06-16 NOTE — ED Notes (Signed)
Nurse starting IV and will draw labs. 

## 2018-06-16 NOTE — ED Triage Notes (Signed)
Pt c/o epigastric pain x1 day that radiations down to her lower abdomen, denies NVD. Endorses tenderness

## 2018-06-17 ENCOUNTER — Emergency Department (HOSPITAL_COMMUNITY): Payer: Self-pay

## 2018-06-17 MED ORDER — ONDANSETRON 4 MG PO TBDP: 4.0000 mg | ORAL_TABLET | Freq: Three times a day (TID) | ORAL | 0 refills | Status: DC | PRN

## 2018-06-17 MED ORDER — IOHEXOL 300 MG/ML  SOLN
100.0000 mL | Freq: Once | INTRAMUSCULAR | Status: AC | PRN
  Administered 2018-06-17: 100 mL via INTRAVENOUS

## 2018-06-17 NOTE — ED Notes (Signed)
Pt discharged from ED; instructions provided and scripts given; Pt encouraged to return to ED if symptoms worsen and to f/u with PCP; Pt verbalized understanding of all instructions 

## 2018-06-17 NOTE — Discharge Instructions (Addendum)
You may alternate Tylenol 1000 mg every 6 hours as needed for pain and Ibuprofen 800 mg every 8 hours as needed for pain.  Please take Ibuprofen with food.  You may take Imodium as needed for diarrhea.  Labs, urine, CT scan today were normal other than showing small bilateral ovarian cyst but this is unlikely causing fever.  Ovarian cyst can cause lower abdominal pain and vomiting.  It is more likely that you have a virus given you have had fevers.  Your appendix was normal on your CT scan.  You do not need antibiotics.

## 2018-06-17 NOTE — ED Provider Notes (Signed)
12:00 AM  Assumed care from Dr. Particia Nearing.  Patient is a 28 year old female who presents to the emergency department with right lower quadrant abdominal pain.  CT abdomen pelvis pending.  Labs, urine unremarkable.  1:15 AM Pt's CT scan shows no acute abnormality.  Normal-appearing appendix.  She does have bilateral ovarian cysts versus follicles but pain does not seem to be in the pelvic region.  She has no vaginal bleeding or discharge.  I do not feel she needs a pelvic exam at this time.  She is very well-appearing currently.  Doubt torsion.  Pregnancy test negative.  Given fevers, vomiting, favor viral illness.  Recommended alternating Tylenol and Motrin for pain.  Will discharge with Zofran.  Discussed return precautions.   At this time, I do not feel there is any life-threatening condition present. I have reviewed and discussed all results (EKG, imaging, lab, urine as appropriate) and exam findings with patient/family. I have reviewed nursing notes and appropriate previous records.  I feel the patient is safe to be discharged home without further emergent workup and can continue workup as an outpatient as needed. Discussed usual and customary return precautions. Patient/family verbalize understanding and are comfortable with this plan.  Outpatient follow-up has been provided as needed. All questions have been answered.    Idalis Hoelting, Layla Maw, DO 06/17/18 [phone]

## 2018-10-09 ENCOUNTER — Other Ambulatory Visit: Payer: Self-pay | Admitting: *Deleted

## 2018-10-09 DIAGNOSIS — Z20822 Contact with and (suspected) exposure to covid-19: Secondary | ICD-10-CM

## 2018-10-09 NOTE — Progress Notes (Signed)
[id number] 

## 2018-12-19 ENCOUNTER — Telehealth (HOSPITAL_COMMUNITY): Payer: Self-pay | Admitting: Professional

## 2018-12-24 ENCOUNTER — Telehealth (HOSPITAL_COMMUNITY): Payer: Self-pay | Admitting: Professional

## 2018-12-24 ENCOUNTER — Ambulatory Visit (HOSPITAL_COMMUNITY): Payer: Self-pay

## 2018-12-25 ENCOUNTER — Ambulatory Visit (HOSPITAL_COMMUNITY): Payer: Self-pay

## 2018-12-25 ENCOUNTER — Telehealth (HOSPITAL_COMMUNITY): Payer: Self-pay | Admitting: Professional
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
## 2018-12-26 ENCOUNTER — Other Ambulatory Visit (HOSPITAL_COMMUNITY): Payer: BC Managed Care – PPO | Attending: Psychiatry | Admitting: Licensed Clinical Social Worker

## 2018-12-26 ENCOUNTER — Other Ambulatory Visit: Payer: Self-pay

## 2018-12-26 ENCOUNTER — Telehealth (HOSPITAL_COMMUNITY): Payer: Self-pay | Admitting: Professional

## 2018-12-26 DIAGNOSIS — Z91013 Allergy to seafood: Secondary | ICD-10-CM | POA: Insufficient documentation

## 2018-12-26 DIAGNOSIS — F419 Anxiety disorder, unspecified: Secondary | ICD-10-CM | POA: Diagnosis not present

## 2018-12-26 DIAGNOSIS — R45851 Suicidal ideations: Secondary | ICD-10-CM | POA: Diagnosis not present

## 2018-12-26 DIAGNOSIS — Z79899 Other long term (current) drug therapy: Secondary | ICD-10-CM | POA: Insufficient documentation

## 2018-12-26 DIAGNOSIS — Z818 Family history of other mental and behavioral disorders: Secondary | ICD-10-CM | POA: Insufficient documentation

## 2018-12-26 DIAGNOSIS — F332 Major depressive disorder, recurrent severe without psychotic features: Secondary | ICD-10-CM

## 2018-12-26 DIAGNOSIS — F322 Major depressive disorder, single episode, severe without psychotic features: Secondary | ICD-10-CM | POA: Insufficient documentation

## 2018-12-27 NOTE — Telephone Encounter (Signed)
Cln called pt to report ins info would work but card would need to be sent when rec. by pt. Pt agrees. Cln reports pt can start PHP tomorrow. Pt agrees. Cln needs pw by 8a and pt agrees. Pt will meet with OT for ax at 8:30am. Pt agrees. Pt denies SI/HI/AVH at this time.

## 2018-12-28 ENCOUNTER — Ambulatory Visit (HOSPITAL_COMMUNITY): Payer: BC Managed Care – PPO

## 2018-12-28 ENCOUNTER — Other Ambulatory Visit: Payer: Self-pay

## 2018-12-28 ENCOUNTER — Other Ambulatory Visit (HOSPITAL_COMMUNITY): Payer: BC Managed Care – PPO

## 2018-12-31 ENCOUNTER — Other Ambulatory Visit: Payer: Self-pay

## 2018-12-31 ENCOUNTER — Other Ambulatory Visit (HOSPITAL_COMMUNITY): Payer: BC Managed Care – PPO
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
## 2019-01-01 ENCOUNTER — Other Ambulatory Visit (HOSPITAL_COMMUNITY): Payer: BC Managed Care – PPO | Admitting: Licensed Clinical Social Worker

## 2019-01-01 ENCOUNTER — Other Ambulatory Visit (HOSPITAL_COMMUNITY): Payer: BC Managed Care – PPO | Admitting: Occupational Therapy

## 2019-01-01 ENCOUNTER — Encounter (HOSPITAL_COMMUNITY): Payer: Self-pay | Admitting: Family

## 2019-01-01 DIAGNOSIS — R4589 Other symptoms and signs involving emotional state: Secondary | ICD-10-CM

## 2019-01-01 DIAGNOSIS — F322 Major depressive disorder, single episode, severe without psychotic features: Secondary | ICD-10-CM

## 2019-01-01 MED ORDER — TRAZODONE HCL 50 MG PO TABS: 50.0000 mg | ORAL_TABLET | Freq: Every day | ORAL | 0 refills | Status: DC
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
## 2019-01-02 ENCOUNTER — Other Ambulatory Visit (HOSPITAL_COMMUNITY): Payer: BC Managed Care – PPO | Admitting: Licensed Clinical Social Worker

## 2019-01-02 ENCOUNTER — Telehealth (HOSPITAL_COMMUNITY): Payer: Self-pay | Admitting: Professional

## 2019-01-02 ENCOUNTER — Other Ambulatory Visit: Payer: Self-pay

## 2019-01-02 ENCOUNTER — Encounter (HOSPITAL_COMMUNITY): Payer: Self-pay | Admitting: Occupational Therapy

## 2019-01-02 DIAGNOSIS — F322 Major depressive disorder, single episode, severe without psychotic features: Secondary | ICD-10-CM

## 2019-01-02 NOTE — Psych (Signed)
Virtual Visit via Video Note  I connected with Kayla Murphy on 01/01/19 at  9:00 AM EDT by a video enabled telemedicine application and verified that I am speaking with the correct person using two identifiers.   I discussed the limitations of evaluation and management by telemedicine and the availability of in person appointments. The patient expressed understanding and agreed to proceed.  I discussed the assessment and treatment plan with the patient. The patient was provided an opportunity to ask questions and all were answered. The patient agreed with the plan and demonstrated an understanding of the instructions.   The patient was advised to call back or seek an in-person evaluation if the symptoms worsen or if the condition fails to improve as anticipated.  I provided 10 minutes of non-face-to-face time during this encounter.  Patient verbally agrees to treatment plan.  Royetta Crochet, [facility]

## 2019-01-02 NOTE — Therapy (Signed)
[place], [place], [address] Phone: [phone]   Fax:  [phone]  Occupational Therapy Evaluation  Patient Details  Name: Kayla Murphy MRN: [id number] Date of Birth: [birth year] Referring Provider (OT): Ricky Ala, NP  Virtual Visit via Video Note  I connected with Kayla Murphy on 01/02/19 at  8:00 AM EDT by a video enabled telemedicine application and verified that I am speaking with the correct person using two identifiers.   I discussed the limitations of evaluation and management by telemedicine and the availability of in person appointments. The patient expressed understanding and agreed to proceed.   I discussed the assessment and treatment plan with the patient. The patient was provided an opportunity to ask questions and all were answered. The patient agreed with the plan and demonstrated an understanding of the instructions.   The patient was advised to call back or seek an in-person evaluation if the symptoms worsen or if the condition fails to improve as anticipated.  I provided 60 minutes of non-face-to-face time during this encounter.   Zenovia Jarred, OT    Encounter Date: 01/01/2019  OT End of Session - 01/02/19 1053    Date for OT Re-Evaluation  01/30/19       Past Medical History:  Diagnosis Date  . 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 Medical history non-contributory     Past Surgical History:  Procedure Laterality Date  . NO PAST SURGERIES      There were no vitals filed for this visit.  Subjective Assessment - 01/02/19 1018    Currently in Pain?  No/denies        [facility] OT Assessment - 01/02/19 0001      Assessment   Medical Diagnosis  Current episode of major depressive disorder without psychotic features    Referring Provider (OT)  Ricky Ala, NP    Onset Date/Surgical Date  01/02/19      Precautions   Precautions  None      Restrictions   Weight  Bearing Restrictions  No      Balance Screen   Has the patient fallen in the past 6 months  No    Has the patient had a decrease in activity level because of a fear of falling?   No          OT assessment: OCAIRS  Diagnosis: Major depressive disorder, recurrent severe without psychotic features  Past medical history/referral information: Pt presented to [facility] after increasing MH symptoms requiring higher level of care. She works as a Pharmacist, hospital, and recently stopped working to join [employer].  Living situation: Pt currently lives with mother  ADLs/IADLs: Decreased engagement due to lack of motivation  Work: Pt works as a Programmer, applications  Leisure: Reports no current leisure engagement, feels very low motivation and lonely  Social support: Pt reports limited social engagement at this time. Not making pro social contact. She describes her social life as "very lonely"  Struggles: motivation, routine management, time management  [place] Summary of Client Scores:  FACILITATES PARTICIPATION IN OCCUPATION  ALLOWS PARTICIPATION IN OCCUPATION INHIBITS PARTICIPATION IN OCCUPATION RESTRICTS PARTICIPATION IN OCCUPATION COMMENTS  ROLES               X    HABITS               X    PERSONAL CAUSATION  X    VALUES               X    INTERESTS               X    SKILLS               X    SHORT TERM GOALS               X    LONG TERM GOALS               X    INTERPETATION OF PAST EXPERIENCES               X    PHYSICAL ENVIRONMENT              X     SOCIAL ENVIRONMENT                            X   READINESS FOR CHANGE               X      Need for Occupational Therapy:  4 Shows positive occupational participation, no need for OT.      3 Need for minimal intervention/consultative participation     X 2 Need for OT intervention indicated to restore/improve participation   1 Need for extensive OT intervention indicated to improve participation.  Referral for  follow up services also recommended.    Assessment:  Patient demonstrates behavior that inhibits participation in occupation.  Patient will benefit from occupational therapy intervention in order to improve time management, financial management, stress management, job readiness skills, social skills, and health management skills in preparation to return to full time community living and to be a productive community member.    Plan:  Patient will participate in skilled occupational therapy sessions individually or in a group setting to improve coping skills, psychosocial skills, and emotional skills required to return to prior level of function.  Treatment will be 4-5 times per week for 3 weeks.         OT TREATMENT  S: My spiritual health is okay, but I really need to improve my professional self care  O: Education given on self care and its importance in supporting mental health. Self care assessment given as a self rating scale for pt to identify areas of strength and weakness in physical, psychological, social, spiritual, and professional self care. Discussion given in how to improve current practices.   A: Pt presents with blunted affect, engaged and participatory throughout session. Pt shares that her spiritual health is very important to her and something she works to maintain. She wants to improve her emotional self care and professional self care. She shares that she is having a difficult time maintaining work/life balance. Education will continue while pt in PHP for continued f/u and maintenance.   P: OT group will be x3 per week while pt in PHP     OT Education - 01/02/19 1020    Education Details  education given on self care    Person(s) Educated  Patient    Methods  Explanation;Handout    Comprehension  Verbalized understanding       OT Short Term Goals - 01/02/19 1025      OT SHORT TERM GOAL #1   Title  Pt will be educated on strategies to improve psychosocial  skills needed to participate  fully in all daily, work, and leisure activities    Time  4    Period  Weeks    Status  New    Target Date  01/30/19      OT SHORT TERM GOAL #2   Title  Pt will apply psychosocial skills and coping mechanisms to daily activities in order to function independently and reintegrate into community    Time  4    Period  Weeks    Status  New      OT SHORT TERM GOAL #3   Title  Pt will engage in goal setting to improve functional BADL/IADL routine upon reintegrating into community.    Time  4    Period  Weeks    Status  New      OT SHORT TERM GOAL #4   Title  Pt will choose and/or engage in 1-3 socially engaging leisure activities to improve social participation upon reintegrating into community    Time  4    Period  Weeks    Status  New      OT SHORT TERM GOAL #5   Title  Pt will create and implement functional BADL/IADL routine upon reintegrating into the community for increased engagement in all daily, work, and leisure activities    Time  4    Period  Weeks    Status  New               Plan - 01/02/19 1022    OT Occupational Profile and History  Detailed Assessment- Review of Records and additional review of physical, cognitive, psychosocial history related to current functional performance    Occupational performance deficits (Please refer to evaluation for details):  ADL's;IADL's;Rest and Sleep;Work;Leisure;Social Participation    Body Structure / Function / Physical Skills  ADL;IADL    Cognitive Skills  Emotional    Psychosocial Skills  Coping Strategies;Routines and Behaviors;Habits    Rehab Potential  Good    Clinical Decision Making  Limited treatment options, no task modification necessary    Comorbidities Affecting Occupational Performance:  None    Modification or Assistance to Complete Evaluation   No modification of tasks or assist necessary to complete eval    OT Frequency  3x / week    OT Duration  4 weeks    OT  Treatment/Interventions  Self-care/ADL training;Coping strategies training;Psychosocial skills training;Other (comment)   community reintegration      Patient will benefit from skilled therapeutic intervention in order to improve the following deficits and impairments:   Body Structure / Function / Physical Skills: ADL, IADL Cognitive Skills: Emotional Psychosocial Skills: Coping Strategies, Routines and Behaviors, Habits   Visit Diagnosis: Current severe episode of major depressive disorder without psychotic features without prior episode ([facility])  Difficulty coping    Problem List Patient Active Problem List   Diagnosis Date Noted  . Major depressive disorder, recurrent episode, severe ([facility]) 12/26/2018   Kayla Murphy, MSOT, OTR/L Behavioral Health OT/ Acute Relief OT PHP Office: [phone]  Kayla Murphy 01/02/2019, 10:53 AM  [facility] PART[facility]PITAL[facility] PROGRAM [address] [place], [place], [address] Phone: [phone]   Fax:  [phone]  Name: [place] Murphy MRN: [id number] Date of Birth: 06-05-1990

## 2019-01-02 NOTE — Progress Notes (Signed)
Spiritual care group 01/02/2019 11:00 - 12:00 ? Group met via web-ex due to COVID-19 precautions.  Group facilitated by Simone Curia, MDiv, BCC  ? ? Group focused on topic of strength. ?Group members reflected on what thoughts and feelings emerge when they hear this topic. ?They then engaged in facilitated dialog around how strength is present in their lives. This dialog focused on representing what strength had been to them in their lives (images and patterns given) and what they saw as helpful in their life now (what they needed / wanted). ? ? Activity drew on narrative framework   Kayla Murphy was present throughout group.  Engaged in discussion with group members and facilitator.  Noted she finds strength in her faith and related that affirmations - whether from herself or others - can give her motivation to keep going.  She spoke of willpower and ability to create affirmation for herself.  Spoke of feeling the importance of this, as she did not get affirmation from her family or origin.

## 2019-01-03 ENCOUNTER — Other Ambulatory Visit (HOSPITAL_COMMUNITY): Payer: Self-pay

## 2019-01-03 ENCOUNTER — Telehealth (HOSPITAL_COMMUNITY): Payer: Self-pay | Admitting: Professional

## 2019-01-03 ENCOUNTER — Other Ambulatory Visit (HOSPITAL_COMMUNITY): Payer: BC Managed Care – PPO

## 2019-01-03 NOTE — Psych (Signed)
Virtual Visit via Video Note  I connected with Kayla Murphy on 01/01/19 at  9:00 AM EDT by a video enabled telemedicine application and verified that I am speaking with the correct person using two identifiers.   I discussed the limitations of evaluation and management by telemedicine and the availability of in person appointments. The patient expressed understanding and agreed to proceed.   I discussed the assessment and treatment plan with the patient. The patient was provided an opportunity to ask questions and all were answered. The patient agreed with the plan and demonstrated an understanding of the instructions.   The patient was advised to call back or seek an in-person evaluation if the symptoms worsen or if the condition fails to improve as anticipated.  Pt was provided 240 minutes of non-face-to-face time during this encounter.   [name], LCSW    [facility] BH PHP THERAPIST PROGRESS NOTE  Kayla Murphy [id number]  Session Time: 9:00 - 10:00  Participation Level: Active  Behavioral Response: CasualAlertDepressed  Type of Therapy: Group Therapy  Treatment Goals addressed: Coping  Interventions: CBT, DBT, Supportive and Reframing  Summary: Clinician led check-in regarding current stressors and situation, and review of patient completed daily inventory. Clinician utilized active listening and empathetic response and validated patient emotions. Clinician facilitated processing group on pertinent issues.   Therapist Response: Kayla Mordan is a 28 y.o. female who presents with depression symptoms.  Patient arrived within time allowed and reports that she is feeling "anxious." Patient rates her mood at a 4 on a scale of 1-10 with 10 being great. Pt reports nerves about starting group and receiving treatment. Pt shares that to prevent her from being alone, she has been with her mom a lot recently. Pt share she appreciates and also has felt infantalized over the past day due to mom  "hovering" and telling her what to do so that she can be prepared for today. Pt compares it to feeling like she is back in elementary school. Pt states she also felt angry yesterday and "everything made me snap." Pt states trying to stay away from people to minimize outbursts and later in the evening she played with a puppy which helped alleviate the anger. Pt reports experiencing some passive SI and having a poor appetite.  Pt able to process. Patient engaged in discussion.       Session Time: 10:00 -11:00  Participation Level: Active  Behavioral Response: CasualAlertDepressed  Type of Therapy: Group Therapy, psychoeducation, psychotherapy  Treatment Goals addressed: Coping  Interventions: CBT, DBT, Solution Focused, Supportive and Reframing  Summary: Cln led discussion on talking to family about symptoms. Group members shared ways in which they struggle with their family members in terms of understanding what pt's are experiencing. Cln encouraged pt's to approach the conversations as an ongoing discussion, utilize videos or articles, and be specific about what they need.   Therapist Response: Pt shares she has a conflictual relationship with her family that is hot and cold. Pt repots she largely feels misunderstood by them, however does not always see the point in trying to talk to them about what is going on with her, including her mental health. Pt able to process.       Session Time: 11:00- 12:00  Participation Level: Active  Behavioral Response: CasualAlertDepressed  Type of Therapy: Group Therapy, Psychoeducation; Psychotherapy  Treatment Goals addressed: Coping  Interventions: CBT; Solution focused; Supportive; Reframing  Summary: Cln introduced topic of boundaries and how having healthier boundaries can influence  all the areas of our lives. Group discussed rigid, porous, and healthy boundaries and when each type can be helpful or hurtful.    Therapist Response: Pt engaged in discussion and reports understanding of boundaries. Pt states she has mainly porous boundaries.        Session Time: 12:00 -1:00  Participation Level:Active  Behavioral Response:CasualAlertDepressed  Type of Therapy: Group Therapy, OT  Treatment Goals addressed: Coping  Interventions:Psychosocial skills training, Supportive,   Summary:12:00 - 12:50:Occupational Therapy group 12:50 -1:00 Clinician led check-out. Clinician assessed for immediate needs, medication compliance and efficacy, and safety concerns   Therapist Response:12:00 - 12:50:Patient engaged in group. See OT note.  12:50 - 1:00: At check-out, patient rates her mood at a 7.5 on a scale of 1-10 with 10 being great.Pt states afternoon plans of spending time with her mom.Patient demonstrates someprogress as evidenced by participating in first group session.Patient denies SI/HI/self-harm at the end of group.     Suicidal/Homicidal: Nowithout intent/plan  Plan: Pt will continue in PHP while working to decrease depression symptoms, increase ability to manage symptoms in a healthy manner, and increase level of daily functioning.   Diagnosis: Current severe episode of major depressive disorder without psychotic features without prior episode ([place]) [F32.2]    1. Current severe episode of major depressive disorder without psychotic features without prior episode [facility])       [name], LCSW 01/03/2019

## 2019-01-04 ENCOUNTER — Encounter (HOSPITAL_COMMUNITY): Payer: Self-pay | Admitting: Occupational Therapy

## 2019-01-04 ENCOUNTER — Other Ambulatory Visit (HOSPITAL_COMMUNITY): Payer: BC Managed Care – PPO | Admitting: Licensed Clinical Social Worker

## 2019-01-04 ENCOUNTER — Other Ambulatory Visit: Payer: Self-pay

## 2019-01-04 ENCOUNTER — Other Ambulatory Visit (HOSPITAL_COMMUNITY): Payer: BC Managed Care – PPO | Admitting: Occupational Therapy

## 2019-01-04 DIAGNOSIS — R4589 Other symptoms and signs involving emotional state: Secondary | ICD-10-CM

## 2019-01-04 DIAGNOSIS — F322 Major depressive disorder, single episode, severe without psychotic features: Secondary | ICD-10-CM

## 2019-01-04 NOTE — Therapy (Signed)
[facility] PARTIAL HOSPITALIZATION PROGRAM [address] [place], [place], [address] Phone: [phone]   Fax:  [phone]  Occupational Therapy Treatment  Patient Details  Name: Kayla Murphy MRN: [id number] Date of Birth: 1991-02-20 Referring Provider (OT): Hillery Jacks, NP  Virtual Visit via Video Note  I connected with Kayla Murphy on 01/04/19 at  8:00 AM EDT by a video enabled telemedicine application and verified that I am speaking with the correct person using two identifiers.   I discussed the limitations of evaluation and management by telemedicine and the availability of in person appointments. The patient expressed understanding and agreed to proceed.   I discussed the assessment and treatment plan with the patient. The patient was provided an opportunity to ask questions and all were answered. The patient agreed with the plan and demonstrated an understanding of the instructions.   The patient was advised to call back or seek an in-person evaluation if the symptoms worsen or if the condition fails to improve as anticipated.  I provided 60 minutes of non-face-to-face time during this encounter.   Dalphine Handing, OT    Encounter Date: 01/04/2019  OT End of Session - 01/04/19 1700    Visit Number  2    Number of Visits  12    Date for OT Re-Evaluation  01/30/19    Authorization Type  [employer]    OT Start Time  1100    OT Stop Time  1200    OT Time Calculation (min)  60 min    Activity Tolerance  Patient tolerated treatment well    Behavior During Therapy  WFL for tasks assessed/performed       Past Medical History:  Diagnosis Date  . 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 Medical history non-contributory     Past Surgical History:  Procedure Laterality Date  . NO PAST SURGERIES      There were no vitals filed for this visit.  Subjective Assessment - 01/04/19 1700    Currently in Pain?  No/denies       S: I use calming apps  O:  Continued education given on healthy stress management strategies  A: Pt presents with blunted affect, engaged and participatory. She shares how she uses calming apps. She shares that she wants to start exercising to improve relaxation and release anger. Continued coping skill exploration to continue next date.  P: OT group will be x3 per week while pt in PHP                  OT Education - 01/04/19 1700    Education Details  education given on stress management    Person(s) Educated  Patient    Methods  Explanation;Handout    Comprehension  Verbalized understanding       OT Short Term Goals - 01/04/19 1701      OT SHORT TERM GOAL #1   Title  Pt will be educated on strategies to improve psychosocial skills needed to participate fully in all daily, work, and leisure activities    Time  4    Period  Weeks    Status  On-going    Target Date  01/30/19      OT SHORT TERM GOAL #2   Title  Pt will apply psychosocial skills and coping mechanisms to daily activities in order to function independently and reintegrate into community    Time  4    Period  Weeks  Status  On-going      OT SHORT TERM GOAL #3   Title  Pt will engage in goal setting to improve functional BADL/IADL routine upon reintegrating into community.    Time  4    Period  Weeks    Status  On-going      OT SHORT TERM GOAL #4   Title  Pt will choose and/or engage in 1-3 socially engaging leisure activities to improve social participation upon reintegrating into community    Time  4    Period  Weeks    Status  On-going      OT SHORT TERM GOAL #5   Title  Pt will create and implement functional BADL/IADL routine upon reintegrating into the community for increased engagement in all daily, work, and leisure activities    Time  4    Period  Weeks    Status  On-going               Plan - 01/04/19 1701    Occupational performance deficits (Please refer to evaluation for details):   ADL's;IADL's;Rest and Sleep;Work;Leisure;Social Participation    Body Structure / Function / Physical Skills  ADL;IADL    Cognitive Skills  Emotional    Psychosocial Skills  Coping Strategies;Routines and Behaviors;Habits       Patient will benefit from skilled therapeutic intervention in order to improve the following deficits and impairments:   Body Structure / Function / Physical Skills: ADL, IADL Cognitive Skills: Emotional Psychosocial Skills: Coping Strategies, Routines and Behaviors, Habits   Visit Diagnosis: Current severe episode of major depressive disorder without psychotic features without prior episode ([place])  Difficulty coping    Problem List Patient Active Problem List   Diagnosis Date Noted  . Major depressive disorder, recurrent episode, severe ([place]) 12/26/2018   Zenovia Jarred, MSOT, OTR/L Behavioral Health OT/ Acute Relief OT PHP Office: [place] 01/04/2019, [place], [place], [address] Phone: [phone]   Fax:  [phone]  Name: Kayla Murphy MRN: [id number] Date of Birth: 01-Mar-1991

## 2019-01-07 ENCOUNTER — Other Ambulatory Visit: Payer: Self-pay

## 2019-01-07 ENCOUNTER — Other Ambulatory Visit (HOSPITAL_COMMUNITY): Payer: BC Managed Care – PPO | Admitting: Licensed Clinical Social Worker

## 2019-01-07 DIAGNOSIS — F332 Major depressive disorder, recurrent severe without psychotic features: Secondary | ICD-10-CM

## 2019-01-07 NOTE — Psych (Signed)
Virtual Visit via Video Note  I connected with Kayla Murphy on 01/02/19 at  9:00 AM EDT by a video enabled telemedicine application and verified that I am speaking with the correct person using two identifiers.   I discussed the limitations of evaluation and management by telemedicine and the availability of in person appointments. The patient expressed understanding and agreed to proceed.   I discussed the assessment and treatment plan with the patient. The patient was provided an opportunity to ask questions and all were answered. The patient agreed with the plan and demonstrated an understanding of the instructions.   The patient was advised to call back or seek an in-person evaluation if the symptoms worsen or if the condition fails to improve as anticipated.  Pt was provided 180 minutes of non-face-to-face time during this encounter.   [name], LCSW    [facility] PHP THERAPIST PROGRESS NOTE  Kayla Kernan [id number]  Session Time: 9:00 - 10:00  Participation Level: Active  Behavioral Response: CasualAlertDepressed  Type of Therapy: Group Therapy  Treatment Goals addressed: Coping  Interventions: CBT, DBT, Supportive and Reframing  Summary: Clinician led check-in regarding current stressors and situation, and review of patient completed daily inventory. Clinician utilized active listening and empathetic response and validated patient emotions. Clinician facilitated processing group on pertinent issues.   Therapist Response: Kayla Goldman is a 28 y.o. female who presents with depression symptoms.  Patient arrived within time allowed and reports that she is feeling "pretty good." Patient rates her mood at a 8 on a scale of 1-10 with 10 being great. Pt reports she "actually had a good day" yesterday and spent time with her mom and niece. Pt shares she walked to the park and spent time in the sunshine which she hadn't done in a long time. Pt reports the interactions with her family  all went well and they tend to be contentious so that was positive. Pt reports struggles with putting herself first. Pt able to process. Patient engaged in discussion.      Session Time: 10:00 -11:00  Participation Level: Active  Behavioral Response: CasualAlertDepressed  Type of Therapy: Group Therapy, psychoeducation, psychotherapy  Treatment Goals addressed: Coping  Interventions: CBT, DBT, Solution Focused, Supportive and Reframing  Summary: Cln led discussion on having difficult conversations. Cln reviewed assertiveness techinques of keeping it short, brief, and to the point and the broken record technique. Cln encouraged pt's to remember what the purpose of each discussion is and not address deeper issues before they are ready.  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 Pt agrees she will focus only on necessary communications right now such as information about her leave, and then the bigger topics once she feels more stable.       Session Time: 11:00 -12:00  Participation Level:Active  Behavioral Response:CasualAlertDepressed  Type of Therapy: Group Therapy, psychotherapy  Treatment Goals addressed: Coping  Interventions:Strengths based, reframing, Supportive,   Summary:Spiritual Care group  Therapist Response: Patient engaged in group. See chaplain note.   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 Pt denies SI/HI before leaving group.      Suicidal/Homicidal: Nowithout intent/plan  Plan: Pt will continue in PHP while working to decrease depression symptoms, increase ability to manage  symptoms in a healthy manner, and increase level of daily functioning.   Diagnosis: Current severe episode of major  depressive disorder without psychotic features without prior episode ([place]) [F32.2]    1. Current severe episode of major depressive disorder without psychotic features without prior episode [facility])       [name], LCSW 01/07/2019

## 2019-01-08 ENCOUNTER — Other Ambulatory Visit: Payer: Self-pay

## 2019-01-08 ENCOUNTER — Encounter (HOSPITAL_COMMUNITY): Payer: Self-pay

## 2019-01-08 ENCOUNTER — Other Ambulatory Visit (HOSPITAL_COMMUNITY): Payer: BC Managed Care – PPO | Admitting: Occupational Therapy

## 2019-01-08 ENCOUNTER — Encounter (HOSPITAL_COMMUNITY): Payer: Self-pay | Admitting: Occupational Therapy

## 2019-01-08 ENCOUNTER — Other Ambulatory Visit (HOSPITAL_COMMUNITY): Payer: BC Managed Care – PPO | Admitting: Licensed Clinical Social Worker

## 2019-01-08 DIAGNOSIS — F322 Major depressive disorder, single episode, severe without psychotic features: Secondary | ICD-10-CM | POA: Diagnosis not present

## 2019-01-08 DIAGNOSIS — R4589 Other symptoms and signs involving emotional state: Secondary | ICD-10-CM

## 2019-01-08 MED ORDER — FLUOXETINE HCL 10 MG PO CAPS: 10.0000 mg | ORAL_CAPSULE | Freq: Every day | ORAL | 2 refills | Status: DC

## 2019-01-08 NOTE — Progress Notes (Signed)
Spoke with patient via Webex video call, used 2 identifiers to correctly identify patient. She has been enjoying groups and feels its a safe place to talk. Most enjoys the feedback she is receiving. On scale 1-10 as 10 being worst she rates depression at 8 and anxiety at 10. She is feeling really anxious because she has a lot of tasks that need to be done but can't seem to do them. Her list is growing and so is her anxiety when they aren't getting done. Denies SI/HI or AV hallucinations. She has been having night sweats for the past week. She started Wellbutrin 3 weeks ago without any other issues besides the night sweats. She will continue to monitor and update [place] if needed. No other issues or complaints.

## 2019-01-08 NOTE — Therapy (Signed)
Virtual Visit via Video Note  I connected with Kayla Murphy on 01/08/19 at  8:00 AM EDT by a video enabled telemedicine application and verified that I am speaking with the correct person using two identifiers.   I discussed the limitations of evaluation and management by telemedicine and the availability of in person appointments. The patient expressed understanding and agreed to proceed.   I discussed the assessment and treatment plan with the patient. The patient was provided an opportunity to ask questions and all were answered. The patient agreed with the plan and demonstrated an understanding of the instructions.   The patient was advised to call back or seek an in-person evaluation if the symptoms worsen or if the condition fails to improve as anticipated.  I provided 60 minutes of non-face-to-face time during this encounter.   Dalphine Handing, OT  [facility] HOSPITALIZATION PROGRAM [address] [place], [place], [address] Phone: [phone]   Fax:  [phone]  Occupational Therapy Treatment  Patient Details  Name: Kayla Murphy MRN: [id number] Date of Birth: 1991/02/26 Referring Provider (OT): Hillery Jacks, NP   Encounter Date: 01/08/2019  OT End of Session - 01/08/19 1630    Visit Number  3    Number of Visits  12    Date for OT Re-Evaluation  01/30/19    Authorization Type  BCBS    OT Start Time  1100    OT Stop Time  1200    OT Time Calculation (min)  60 min    Activity Tolerance  Patient tolerated treatment well    Behavior During Therapy  [facility] for tasks assessed/performed       Past Medical History:  Diagnosis Date  . 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 Medical history non-contributory     Past Surgical History:  Procedure Laterality Date  . NO PAST SURGERIES      There were no vitals filed for this visit.  Subjective Assessment - 01/08/19 1630    Currently in Pain?  No/denies         S: I feel the coping skills  are working well for me that we talked about so far  O: Continued education given on coping skills this date. Emphasis on positive mental attitude specific skills with journaling given. Pt to identify preferred skills.   A: Pt presents with brighter affect, engaged and participatory. She shares how she feels the skills that have already been discussed are starting to become more natural. She reports overall feeling an increased ability to cope and motivation to learn more skills. Pt wouldl ike to try journaling gratitudes after learning information discussed in this session.  P: OT group will be x3 per week while pt in PHP               OT Education - 01/08/19 1630    Education Details  continued education given on stress management skills    Person(s) Educated  Patient    Methods  Explanation;Handout    Comprehension  Verbalized understanding       OT Short Term Goals - 01/04/19 1701      OT SHORT TERM GOAL #1   Title  Pt will be educated on strategies to improve psychosocial skills needed to participate fully in all daily, work, and leisure activities    Time  4    Period  Weeks    Status  On-going    Target Date  01/30/19  OT SHORT TERM GOAL #2   Title  Pt will apply psychosocial skills and coping mechanisms to daily activities in order to function independently and reintegrate into community    Time  4    Period  Weeks    Status  On-going      OT SHORT TERM GOAL #3   Title  Pt will engage in goal setting to improve functional BADL/IADL routine upon reintegrating into community.    Time  4    Period  Weeks    Status  On-going      OT SHORT TERM GOAL #4   Title  Pt will choose and/or engage in 1-3 socially engaging leisure activities to improve social participation upon reintegrating into community    Time  4    Period  Weeks    Status  On-going      OT SHORT TERM GOAL #5   Title  Pt will create and implement functional BADL/IADL routine upon  reintegrating into the community for increased engagement in all daily, work, and leisure activities    Time  4    Period  Weeks    Status  On-going               Plan - 01/08/19 1630    Occupational performance deficits (Please refer to evaluation for details):  ADL's;IADL's;Rest and Sleep;Work;Leisure;Social Participation    Body Structure / Function / Physical Skills  ADL;IADL    Cognitive Skills  Emotional    Psychosocial Skills  Coping Strategies;Routines and Behaviors;Habits       Patient will benefit from skilled therapeutic intervention in order to improve the following deficits and impairments:   Body Structure / Function / Physical Skills: ADL, IADL Cognitive Skills: Emotional Psychosocial Skills: Coping Strategies, Routines and Behaviors, Habits   Visit Diagnosis: Current severe episode of major depressive disorder without psychotic features without prior episode ([place])  Difficulty coping    Problem List Patient Active Problem List   Diagnosis Date Noted  . Major depressive disorder, recurrent episode, severe ([place]) 12/26/2018   Zenovia Jarred, MSOT, OTR/L Behavioral Health OT/ Acute Relief OT [facility] Office: [phone]  Zenovia Jarred 01/08/2019, 4:31 PM  [facility] HOSPITALIZATION PROGRAM [place], [place], [address] Phone: [phone]   Fax:  [phone]  Name: Kayla Murphy MRN: [id number] Date of Birth: Aug 03, 1990

## 2019-01-08 NOTE — Progress Notes (Signed)
Virtual Visit via Telephone Note  I connected with Kayla Murphy on 01/08/19 at  9:00 AM EDT by telephone and verified that I am speaking with the correct person using two identifiers.   I discussed the limitations, risks, security and privacy concerns of performing an evaluation and management service by telephone and the availability of in person appointments. I also discussed with the patient that there may be a patient responsible charge related to this service. The patient expressed understanding and agreed to proceed.     I discussed the assessment and treatment plan with the patient. The patient was provided an opportunity to ask questions and all were answered. The patient agreed with the plan and demonstrated an understanding of the instructions.   The patient was advised to call back or seek an in-person evaluation if the symptoms worsen or if the condition fails to improve as anticipated.  I provided 15 minutes of non-face-to-face time during this encounter.   Kayla Murphy N Kayla Luckenbaugh, NP   [facility] OP Progress Note  01/08/2019 10:27 AM Kayla Wooley  MRN:  [id number]  Evaluation: Patient was evaluated telephonically.  Reporting worsening depression, worthlessness and intermittent tearfulness related to current relationship stressors.  As discussed during intake on admission, patient to follow-up with literature related to Prozac.  Patient was receptive to plan and stated she was interested in starting Prozac in addition to Wellbutrin.  Discussed titration to Wellbutrin last week.  Patient reports she has been reluctant to start trazodone.  Patient was encouraged to initiate trazodone while attending partial hospitalization programming.  Patient reports she will take medications tonight.  And will follow up.  Reports improving appetite.  Continues to reports restless nights.  Rates her depression 7 out of 10 with 10 being the worst.  Denies suicidal or homicidal ideation.  Denies auditory or  visual hallucinations.  We will continue to monitor for side effects/symptoms.  Support,encouragement and  reassurance was provided.    Visit Diagnosis:    ICD-10-CM   1. Current severe episode of major depressive disorder without psychotic features without prior episode ([facility])  F32.2   2. Difficulty coping  R45.89     Past Psychiatric History:   Past Medical History:  Past Medical History:  Diagnosis Date  . 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 Medical history non-contributory     Past Surgical History:  Procedure Laterality Date  . NO PAST SURGERIES      Family Psychiatric History:   Family History: No family history on file.  Social History:  Social History   Socioeconomic History  . Marital status: Single    Spouse name: Not on file  . Number of children: Not on file  . Years of education: Not on file  . Highest education level: Not on file  Occupational History  . Not on file  Social Needs  . Financial resource strain: Not on file  . Food insecurity    Worry: Not on file    Inability: Not on file  . Transportation needs    Medical: Not on file    Non-medical: Not on file  Tobacco Use  . Smoking status: Never Smoker  . Smokeless tobacco: Never Used  Substance and Sexual Activity  . Alcohol use: Yes    Comment: socially  . Drug use: No  . Sexual activity: Yes    Birth control/protection: Condom  Lifestyle  . Physical activity    Days per week: Not on file  Minutes per session: Not on file  . Stress: Not on file  Relationships  . Social Musician on phone: Not on file    Gets together: Not on file    Attends religious service: Not on file    Active member of club or organization: Not on file    Attends meetings of clubs or organizations: Not on file    Relationship status: Not on file  Other Topics Concern  . Not on file  Social History Narrative  . Not on file    Allergies:  Allergies  Allergen Reactions  . Shellfish Allergy  Anaphylaxis    Metabolic Disorder Labs: No results found for: HGBA1C, MPG No results found for: PROLACTIN No results found for: CHOL, TRIG, HDL, CHOLHDL, VLDL, LDLCALC No results found for: TSH  Therapeutic Level Labs: No results found for: LITHIUM No results found for: VALPROATE No components found for:  CBMZ  Current Medications: Current Outpatient Medications  Medication Sig Dispense Refill  . acetaminophen (TYLENOL) 500 MG tablet Take 500 mg by mouth every 6 (six) hours as needed for mild pain or fever.    . Acetaminophen-DM (COLD & COUGH DAYTIME PO) Take 2 tablets by mouth every 8 (eight) hours as needed (cold and cough).    Marland Kitchen buPROPion (WELLBUTRIN XL) 150 MG 24 hr tablet Take 150 mg by mouth every morning.    Marland Kitchen EPINEPHrine (EPIPEN 2-PAK) 0.3 mg/0.3 mL IJ SOAJ injection Inject 0.3 mg into the muscle once.    . ferrous sulfate 325 (65 FE) MG tablet Take 325 mg by mouth every morning.    Marland Kitchen FLUoxetine (PROZAC) 10 MG capsule Take 1 capsule (10 mg total) by mouth daily. 30 capsule 2  . ondansetron (ZOFRAN ODT) 4 MG disintegrating tablet Take 1 tablet (4 mg total) by mouth every 8 (eight) hours as needed for nausea or vomiting. 20 tablet 0  . traZODone (DESYREL) 50 MG tablet Take 1 tablet (50 mg total) by mouth at bedtime. 30 tablet 0  . valACYclovir (VALTREX) 1000 MG tablet Take 1,000 mg by mouth as needed.     No current facility-administered medications for this visit.      Musculoskeletal: Strength & Muscle Tone: within normal limits Gait & Station: normal Patient leans: N/A  Psychiatric Specialty Exam: ROS  There were no vitals taken for this visit.There is no height or weight on file to calculate BMI.  General Appearance: NA  Eye Contact:  NA  Speech:  Clear and Coherent  Volume:  Normal  Mood:  Anxious and Depressed  Affect:  Congruent  Thought Process:  Coherent  Orientation:  Full (Time, Place, and Person)  Thought Content: Logical   Suicidal Thoughts:  No   Homicidal Thoughts:  No  Memory:  Immediate;   Fair Recent;   Fair  Judgement:  Fair  Insight:  Fair  Psychomotor Activity:  Normal  Concentration:  Concentration: Fair  Recall:  [employer] of Knowledge: Fair  Language: Fair  Akathisia:  No  Handed:  Right  AIMS (if indicated): done  Assets:  Communication Skills Desire for Improvement Resilience Social Support  ADL's:  Intact  Cognition: WNL  Sleep:  Poor   Screenings: PHQ2-9     Counselor from 01/01/2019 in BEHAVIORAL HEALTH PARTIAL HOSPITALIZATION PROGRAM  PHQ-2 Total Score  6  PHQ-9 Total Score  21       Assessment and Plan:  Continue partial hospitalization programming Initiated Prozac 10 mg p.o. daily Increase  Wellbutrin 150 to 300 mg p.o. daily Start trazodone 50 mg p.o. nightly  Treatment plan was reviewed and agreed upon by NP T. Bobby Rumpf and patient Niger Madariaga for continued group services.  Derrill Center, NP 01/08/2019, 10:27 AM

## 2019-01-09 ENCOUNTER — Other Ambulatory Visit: Payer: Self-pay

## 2019-01-09 ENCOUNTER — Other Ambulatory Visit (HOSPITAL_COMMUNITY): Payer: BC Managed Care – PPO | Admitting: Licensed Clinical Social Worker

## 2019-01-09 DIAGNOSIS — F322 Major depressive disorder, single episode, severe without psychotic features: Secondary | ICD-10-CM | POA: Diagnosis not present

## 2019-01-10 ENCOUNTER — Other Ambulatory Visit (HOSPITAL_COMMUNITY): Payer: BC Managed Care – PPO | Admitting: Occupational Therapy

## 2019-01-10 ENCOUNTER — Other Ambulatory Visit: Payer: Self-pay

## 2019-01-10 ENCOUNTER — Other Ambulatory Visit (HOSPITAL_COMMUNITY): Payer: BC Managed Care – PPO | Admitting: Licensed Clinical Social Worker

## 2019-01-10 ENCOUNTER — Encounter (HOSPITAL_COMMUNITY): Payer: Self-pay | Admitting: Occupational Therapy

## 2019-01-10 DIAGNOSIS — R4589 Other symptoms and signs involving emotional state: Secondary | ICD-10-CM

## 2019-01-10 DIAGNOSIS — F322 Major depressive disorder, single episode, severe without psychotic features: Secondary | ICD-10-CM

## 2019-01-10 NOTE — Psych (Signed)
Virtual Visit via Video Note  I connected with Kayla Murphy on 01/04/19 at  9:00 AM EDT by a video enabled telemedicine application and verified that I am speaking with the correct person using two identifiers.   I discussed the limitations of evaluation and management by telemedicine and the availability of in person appointments. The patient expressed understanding and agreed to proceed.   I discussed the assessment and treatment plan with the patient. The patient was provided an opportunity to ask questions and all were answered. The patient agreed with the plan and demonstrated an understanding of the instructions.   The patient was advised to call back or seek an in-person evaluation if the symptoms worsen or if the condition fails to improve as anticipated.  Pt was provided 240 minutes of non-face-to-face time during this encounter.   Kayla Guiles, LCSW    [facility] BH PHP THERAPIST PROGRESS NOTE  Kayla Kennard [id number]  Session Time: 9:00 - 10:00  Participation Level: Active  Behavioral Response: CasualAlertDepressed  Type of Therapy: Group Therapy  Treatment Goals addressed: Coping  Interventions: CBT, DBT, Supportive and Reframing  Summary: Clinician led check-in regarding current stressors and situation, and review of patient completed daily inventory. Clinician utilized active listening and empathetic response and validated patient emotions. Clinician facilitated processing group on pertinent issues.   Therapist Response: Kayla Murphy is a 28 y.o. female who presents with depression symptoms. Patient arrived within time allowed and reports that she is feeling "okay." Patient rates her mood at a 6 on a scale of 1-10 with 10 being great. Pt reports she had a "rough" day yesterday. Pt reports low mood and not wanting to do anything. Pt shares she is having issue with a person she is dating and that precipitated the bad mood. Pt reports crying this morning due to stress about  work and not knowing how to handle her absence. Pt is able to process and determines she will turn off her work email notifications so it does not invade her mind so often. Patient engaged in discussion.       Session Time: 10:00 -11:00  Participation Level: Active  Behavioral Response: CasualAlertDepressed  Type of Therapy: Group Therapy, psychoeducation, psychotherapy  Treatment Goals addressed: Coping  Interventions: CBT, DBT, Solution Focused, Supportive and Reframing  Summary: Cln led discussion on "filling time" and finding ways that are more nourishing than others. Group members shared activities that are working for them currently.   Therapist Response: Pt reports she has had too much empty time recently. Pt states she just started using a coloring app on her phone and it is the best thing to help her stay level currently. Other than that, pt shares she tries to spend time with people to fill space. Pt identifies wants of going outside, cleaning, reading, and finding new hobbies as things to explore and add to her routine.        Session Time: 11:00- 12:00  Participation Level: Active  Behavioral Response: CasualAlertDepressed  Type of Therapy: Group Therapy, Psychoeducation; Psychotherapy  Treatment Goals addressed: Coping  Interventions: CBT; Solution focused; Supportive; Reframing  Summary: Cln continued topic of boundaries. Cln led boundary workshop in which group members identified a boundary issue they are experiencing and group provided feedback based on boundary principles. Cln helped shape and maintain fidelity to principles.   Therapist Response: Pt engaged in workshop both giving an example and providing helpful feedback to others. Pt shares boundary issue of accepting misleading communication from the person  she is dating. Pt reports frequent conflict because her partner will make lies of omission or take what pt asked very literally  and then try to "justify" the behaviors when pt is upset. Pt states having many discussion about this and the behaviors continue. Pt reports willingness to try increasing the specificity of her boundary, using reinforcement, and following through as ways to increase efficacy of boundary.        Session Time: 12:00 -1:00  Participation Level:Active  Behavioral Response:CasualAlertDepressed  Type of Therapy: Group Therapy, OT  Treatment Goals addressed: Coping  Interventions:Psychosocial skills training, Supportive,   Summary:12:00 - 12:50:Occupational Therapy group 12:50 -1:00 Clinician led check-out. Clinician assessed for immediate needs, medication compliance and efficacy, and safety concerns   Therapist Response:12:00 - 12:50:Patient engaged in group. See OT note.  12:50 - 1:00: At check-out, patient rates her mood at a 9.5 on a scale of 1-10 with 10 being great.Pt states afternoon plans of braiding her hair, organizing her shower caddy, and going outside.Patient demonstrates someprogress as evidenced by participating increased oppenness.Patient denies SI/HI/self-harm at the end of group.     Suicidal/Homicidal: Nowithout intent/plan  Plan: Pt will continue in PHP while working to decrease depression symptoms, increase ability to manage symptoms in a healthy manner, and increase level of daily functioning.   Diagnosis: Current severe episode of major depressive disorder without psychotic features without prior episode ([place]) [F32.2]    1. Current severe episode of major depressive disorder without psychotic features without prior episode [facility])       Kayla Glass, LCSW 01/10/2019

## 2019-01-10 NOTE — Therapy (Signed)
[facility] PARTIAL HOSPITALIZATION PROGRAM [address] [place], [place], [address] Phone: [phone]   Fax:  [phone]  Occupational Therapy Treatment  Patient Details  Name: Kayla Murphy MRN: [id number] Date of Birth: 12-01-1990 Referring Provider (OT): Hillery Jacks, NP   Virtual Visit via Video Note  I connected with Kayla Murphy on 01/10/19 at  8:00 AM EDT by a video enabled telemedicine application and verified that I am speaking with the correct person using two identifiers.   I discussed the limitations of evaluation and management by telemedicine and the availability of in person appointments. The patient expressed understanding and agreed to proceed.   I discussed the assessment and treatment plan with the patient. The patient was provided an opportunity to ask questions and all were answered. The patient agreed with the plan and demonstrated an understanding of the instructions.   The patient was advised to call back or seek an in-person evaluation if the symptoms worsen or if the condition fails to improve as anticipated.  I provided 60 minutes of non-face-to-face time during this encounter.   Dalphine Handing, OT    Encounter Date: 01/10/2019  OT End of Session - 01/10/19 1734    Visit Number  4    Number of Visits  12    Date for OT Re-Evaluation  01/30/19    Authorization Type  BC[employer]    OT Start Time  1100    OT Stop Time  1200    OT Time Calculation (min)  60 min    Activity Tolerance  Patient tolerated treatment well    Behavior During Therapy  WFL for tasks assessed/performed       Past Medical History:  Diagnosis Date  . 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 Medical history non-contributory     Past Surgical History:  Procedure Laterality Date  . NO PAST SURGERIES      There were no vitals filed for this visit.  Subjective Assessment - 01/10/19 1734    Currently in Pain?  No/denies        S: I may struggle with  the implementation of some of these    O: Pt educated on sleep hygiene as it pertains to daily life/routines this date. Education given on appropriate sleep routines, sleep disorders, detriments of too much/too little sleep with encouraged feedback of personal Experiences. Sleep hygiene handout given for pt to choose new areas for implementation in BADL routine. Further education given on relaxation techniques to implement before bed. Pt asked to identify one STG in relation to sleep hygiene to create better daily sleep habits.     A: Pt presents with blunted affect, engaged and participatory. She shares how her sleep has improved with use of sleep medicine, but she is groggy. Pt wants to try using apps and her phone on "sleep mode" as an accountability factor to maintain sleep hygiene. She also plans to try "worry time" to limit racing thoughts before bed. She shares she is unsure how to implement all of these skills, will continue to follow pt and check in to ensure successful implementation.  P: OT Group will be x3 per week while pt in PHP             OT Education - 01/10/19 1734    Education Details  education given on sleep hygiene    Person(s) Educated  Patient    Methods  Explanation;Handout    Comprehension  Verbalized understanding  OT Short Term Goals - 01/04/19 1701      OT SHORT TERM GOAL #1   Title  Pt will be educated on strategies to improve psychosocial skills needed to participate fully in all daily, work, and leisure activities    Time  4    Period  Weeks    Status  On-going    Target Date  01/30/19      OT SHORT TERM GOAL #2   Title  Pt will apply psychosocial skills and coping mechanisms to daily activities in order to function independently and reintegrate into community    Time  4    Period  Weeks    Status  On-going      OT SHORT TERM GOAL #3   Title  Pt will engage in goal setting to improve functional BADL/IADL routine upon reintegrating  into community.    Time  4    Period  Weeks    Status  On-going      OT SHORT TERM GOAL #4   Title  Pt will choose and/or engage in 1-3 socially engaging leisure activities to improve social participation upon reintegrating into community    Time  4    Period  Weeks    Status  On-going      OT SHORT TERM GOAL #5   Title  Pt will create and implement functional BADL/IADL routine upon reintegrating into the community for increased engagement in all daily, work, and leisure activities    Time  4    Period  Weeks    Status  On-going               Plan - 01/10/19 1734    Occupational performance deficits (Please refer to evaluation for details):  ADL's;IADL's;Rest and Sleep;Work;Leisure;Social Participation    Body Structure / Function / Physical Skills  ADL;IADL    Cognitive Skills  Emotional    Psychosocial Skills  Coping Strategies;Routines and Behaviors;Habits       Patient will benefit from skilled therapeutic intervention in order to improve the following deficits and impairments:   Body Structure / Function / Physical Skills: ADL, IADL Cognitive Skills: Emotional Psychosocial Skills: Coping Strategies, Routines and Behaviors, Habits   Visit Diagnosis: Current severe episode of major depressive disorder without psychotic features without prior episode ([place])  Difficulty coping    Problem List Patient Active Problem List   Diagnosis Date Noted  . Major depressive disorder, recurrent episode, severe ([place]) 12/26/2018   Zenovia Jarred, MSOT, OTR/L Behavioral Health OT/ Acute Relief OT PHP Office: [phone]  Zenovia Jarred 01/10/2019, [place], [place], [address] Phone: [phone]   Fax:  [phone]  Name: Kayla Murphy MRN: [id number] Date of Birth: 1990-07-10

## 2019-01-10 NOTE — Progress Notes (Signed)
Pt attended spiritual care group   11:00-12:00.  Group met via web-ex due to COVID-19 precautions.  Group facilitated by Chaplain Godson Pollan, MDiv, BCC   Group focused on topic of "community."  Members reflected on topic in facilitated dialog, identifying responses to topic and notions they hold of community from their previous experience.  Group members utilized value sort cards to identify top qualities they look for in community.  Engaged in facilitated dialog around their value choices, noting origin of these values, how these are realized in their lives, and strategies for engaging these values.    Spiritual care group drew on Motivational Interviewing, Narrative and Adlerian modalities. 

## 2019-01-10 NOTE — Psych (Signed)
Virtual Visit via Video Note  I connected with Kayla Murphy on 01/07/19 at  9:00 AM EDT by a video enabled telemedicine application and verified that I am speaking with the correct person using two identifiers.   I discussed the limitations of evaluation and management by telemedicine and the availability of in person appointments. The patient expressed understanding and agreed to proceed.   I discussed the assessment and treatment plan with the patient. The patient was provided an opportunity to ask questions and all were answered. The patient agreed with the plan and demonstrated an understanding of the instructions.   The patient was advised to call back or seek an in-person evaluation if the symptoms worsen or if the condition fails to improve as anticipated.  Pt was provided 120 minutes of non-face-to-face time during this encounter.   [name], LCSW    [facility] PHP THERAPIST PROGRESS NOTE  Kayla Murphy [id number]  Session Time: 9:00 - 11:00  Participation Level: Active  Behavioral Response: CasualAlertDepressed  Type of Therapy: Individual Therapy  Treatment Goals addressed: Coping  Interventions: CBT, DBT, Supportive and Reframing  Summary: Clinician led check-in regarding current stressors and situation, and review of patient completed daily inventory. Clinician utilized active listening and empathetic response and validated patient emotions. Clinician facilitated discussion on issues with pt's relationship. Cln provided space for pt to process confusion and struggles. Cln brought in topics of  boundaries, trust, and meeting needs. Cln assessed for risk.   Therapist Response: Kayla Murphy is a 28 y.o. female who presents with depression symptoms. Patient arrived within time allowed and reports that she is feeling "not good." Patient rates her mood at a 3 on a scale of 1-10 with 10 being great. Pt reports she had a "really bad" day yesterday. Pt states she called the  crisis line yesterday afternoon and was told to go to the hospital to be evaluated but chose to stay with her mom instead. Pt states having SI and hopelessness however it has alleviated since yesterday. Pt states she is no longer having SI but does feel hopeless. Pt reports her weekend had gone well prior to Sunday and that she saw family and friends. Pt shares that issues in her relationship came to a point yesterday and she is unsure whether they will be able to make it. Pt reports this 1.5 year relationship has been unsteady from the beginning, but now is worse. Pt identifies as bisexual and her partner is a woman. Pt shares her partner has never had a same-sex relationship and is unsure about what it means that she is in this relationship with pt. Yesterday, pt states she found out that her partner has been in a relationship with another person as well and has not been honest with either of them. Pt is tearful and reports confusion, hurt, and anger. Pt is able to process struggles with her sexuality and accepting it and how that has affected the way she invested into this relationship. Pt seeks to understand what she wants out of a relationship and how to consider thinking about what to do if this relationship doesn't meet her needs. Pt chose to leave session at 11 stating the discussion has "wiped me out" and she feels emotionally drained.Patient denies SI/HI/self-harm at the end of group.     Suicidal/Homicidal: Nowithout intent/plan  Plan: Pt will continue in PHP while working to decrease depression symptoms, increase ability to manage symptoms in a healthy manner, and increase level of daily functioning.  Diagnosis: Severe episode of recurrent major depressive disorder, without psychotic features (HCC) [F33.2]    1. Severe episode of recurrent major depressive disorder, without psychotic features (HCC)       [name], LCSW 01/10/2019

## 2019-01-11 ENCOUNTER — Other Ambulatory Visit (HOSPITAL_COMMUNITY): Payer: BC Managed Care – PPO | Admitting: Occupational Therapy

## 2019-01-11 ENCOUNTER — Other Ambulatory Visit: Payer: Self-pay

## 2019-01-11 ENCOUNTER — Encounter (HOSPITAL_COMMUNITY): Payer: Self-pay | Admitting: Occupational Therapy

## 2019-01-11 ENCOUNTER — Other Ambulatory Visit (HOSPITAL_COMMUNITY): Payer: BC Managed Care – PPO | Admitting: Family

## 2019-01-11 DIAGNOSIS — F322 Major depressive disorder, single episode, severe without psychotic features: Secondary | ICD-10-CM

## 2019-01-11 DIAGNOSIS — R4589 Other symptoms and signs involving emotional state: Secondary | ICD-10-CM

## 2019-01-11 NOTE — Therapy (Signed)
[place], [place], [address] Phone: [phone]   Fax:  [phone]  Occupational Therapy Treatment  Patient Details  Name: Kayla Murphy MRN: [id number] Date of Birth: 07-19-1990 Referring Provider (OT): Ricky Ala, NP  Virtual Visit via Video Note  I connected with Kayla Murphy on 01/11/19 at  8:00 AM EDT by a video enabled telemedicine application and verified that I am speaking with the correct person using two identifiers.   I discussed the limitations of evaluation and management by telemedicine and the availability of in person appointments. The patient expressed understanding and agreed to proceed.  I discussed the assessment and treatment plan with the patient. The patient was provided an opportunity to ask questions and all were answered. The patient agreed with the plan and demonstrated an understanding of the instructions.   The patient was advised to call back or seek an in-person evaluation if the symptoms worsen or if the condition fails to improve as anticipated.  I provided 60 minutes of non-face-to-face time during this encounter.   Zenovia Jarred, OT    Encounter Date: 01/11/2019  OT End of Session - 01/11/19 1614    Visit Number  5    Number of Visits  12    Date for OT Re-Evaluation  01/30/19    Authorization Type  BCBS    OT Start Time  1100    OT Stop Time  1200    OT Time Calculation (min)  60 min    Activity Tolerance  Patient tolerated treatment well    Behavior During Therapy  WFL for tasks assessed/performed       Past Medical History:  Diagnosis Date  . 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 Medical history non-contributory     Past Surgical History:  Procedure Laterality Date  . NO PAST SURGERIES      There were no vitals filed for this visit.  Subjective Assessment - 01/11/19 1613    Currently in Pain?  No/denies       S: I want to improve my mental  health  O: Education given on self-accountability being in line with personal values and goals to maintain occupational balance in various community settings. Pt given goal identifying worksheet to list immediate, short term, medium term, and long-term goals using a SMART goal framework (specificity, meaningful, adaptive, realistic, and time bound). Goals created as guideline for pt to practice being accountable in various situations. Pt completed work sheet of goals and encouraged to share goals with the group, with emphasis on immediate goal for check in with pt for next session to maintain accountability.  A: Pt presents with blunted affect, engaged and participatory throughout session. Pt formed goals around personal growth and leisure. She wants to be able to complete leisure activiteis by herself without going with other people. She also makes goals surrounding home maintaining and routines.   P: OT group will be x3 per week while pt in PHP                   OT Education - 01/11/19 1613    Education Details  education given on smart goals    Person(s) Educated  Patient    Methods  Explanation;Handout    Comprehension  Verbalized understanding       OT Short Term Goals - 01/04/19 1701      OT SHORT TERM GOAL #1   Title  Pt  will be educated on strategies to improve psychosocial skills needed to participate fully in all daily, work, and leisure activities    Time  4    Period  Weeks    Status  On-going    Target Date  01/30/19      OT SHORT TERM GOAL #2   Title  Pt will apply psychosocial skills and coping mechanisms to daily activities in order to function independently and reintegrate into community    Time  4    Period  Weeks    Status  On-going      OT SHORT TERM GOAL #3   Title  Pt will engage in goal setting to improve functional BADL/IADL routine upon reintegrating into community.    Time  4    Period  Weeks    Status  On-going      OT SHORT TERM GOAL  #4   Title  Pt will choose and/or engage in 1-3 socially engaging leisure activities to improve social participation upon reintegrating into community    Time  4    Period  Weeks    Status  On-going      OT SHORT TERM GOAL #5   Title  Pt will create and implement functional BADL/IADL routine upon reintegrating into the community for increased engagement in all daily, work, and leisure activities    Time  4    Period  Weeks    Status  On-going               Plan - 01/11/19 1614    Occupational performance deficits (Please refer to evaluation for details):  ADL's;IADL's;Rest and Sleep;Work;Leisure;Social Participation    Body Structure / Function / Physical Skills  ADL;IADL    Cognitive Skills  Emotional    Psychosocial Skills  Coping Strategies;Routines and Behaviors;Habits       Patient will benefit from skilled therapeutic intervention in order to improve the following deficits and impairments:   Body Structure / Function / Physical Skills: ADL, IADL Cognitive Skills: Emotional Psychosocial Skills: Coping Strategies, Routines and Behaviors, Habits   Visit Diagnosis: Current severe episode of major depressive disorder without psychotic features without prior episode (HCC)  Difficulty coping    Problem List Patient Active Problem List   Diagnosis Date Noted  . Major depressive disorder, recurrent episode, severe (HCC) 12/26/2018   Dalphine Handing, MSOT, OTR/L Behavioral Health OT/ Acute Relief OT PHP Office: [phone]  Dalphine Handing 01/11/2019, 4:15 PM  [facility] PARTIAL HOSPITALIZATION PROGRAM [address] [place], [place], [address] Phone: [phone]   Fax:  [phone]  Name: Kayla Murphy MRN: [id number] Date of Birth: 07/21/1990

## 2019-01-12 NOTE — Psych (Signed)
Virtual Visit via Video Note  I connected with Kayla Murphy on 01/08/19 at  9:00 AM EDT by a video enabled telemedicine application and verified that I am speaking with the correct person using two identifiers.   I discussed the limitations of evaluation and management by telemedicine and the availability of in person appointments. The patient expressed understanding and agreed to proceed.   I discussed the assessment and treatment plan with the patient. The patient was provided an opportunity to ask questions and all were answered. The patient agreed with the plan and demonstrated an understanding of the instructions.   The patient was advised to call back or seek an in-person evaluation if the symptoms worsen or if the condition fails to improve as anticipated.  Pt was provided 240 minutes of non-face-to-face time during this encounter.   [name], LCSW    [facility] PHP THERAPIST PROGRESS NOTE  Kayla Murphy [id number]  Session Time: 9:00 - 10:00  Participation Level: Active  Behavioral Response: CasualAlertDepressed  Type of Therapy: Group Therapy  Treatment Goals addressed: Coping  Interventions: CBT, DBT, Supportive and Reframing  Summary: Clinician led check-in regarding current stressors and situation, and review of patient completed daily inventory. Clinician utilized active listening and empathetic response and validated patient emotions. Clinician facilitated discussion on issues that came up during check-in.  Therapist Response: Kayla Murphy is a 28 y.o. female who presents with depression symptoms. Patient arrived within time allowed and reports that she is feeling "a little happy, but a little down." Patient rates her mood at a 6 on a scale of 1-10 with 10 being great. Pt reports she struggled with her mood and hopelessness yesterday. Pt reports managing by seeking to "find a happy moment" when she noticed she was low. Pt states she did speak to her partner and that  "made it worse." Pt shares she woke up this morning and "tired to match the sun" and stay positive. Pt reports it is "mind over matter" right now and she is trying to stay ok, but "I think I might lose the fight." Pt able to process. Pt reports struggles with night sweats over the past week. Pt engaged in discussion.        Session Time: 10:00 -11:00  Participation Level: Active  Behavioral Response: CasualAlertDepressed  Type of Therapy: Group Therapy, psychoeducation, psychotherapy  Treatment Goals addressed: Coping  Interventions: CBT, DBT, Solution Focused, Supportive and Reframing  Summary: Cln led discussion on communicating with people who are agressive. Group members discussed people in their life that they struggle to communicate with due to aggression and how to navigate the situation.  Cln encouraged group members to consider the purpose of their comminication and seek to mitigate risk.   Therapist Response: Pt reports she does not like conflict and will avoid conversations with people that she expects won't take it well. Pt corrects herself later and states that that is only true when she is "level" and when she is upset or "over it" she will say whatever in the moment. Pt identifies remembering the purpose of the communication as something to try to make communications smoother.       Session Time: 11:00 -12:00  Participation Level:Active  Behavioral Response:CasualAlertDepressed  Type of Therapy: Group Therapy, OT  Treatment Goals addressed: Coping  Interventions:Psychosocial skills training, Supportive,   Summary: Occupational Therapy group  Therapist Response:Patient engaged in group. See OT note.         Session Time: 12:00- 1:00  Participation  Level: Active  Behavioral Response: CasualAlertDepressed  Type of Therapy: Group Therapy, Psychoeducation; Psychotherapy  Treatment Goals addressed: Coping  Interventions:  CBT; Solution focused; Supportive; Reframing  Summary: Cln introduced topic of cognitive distortions including what they are and how they affect our mental health. Cln introduced Catch-Challenge-Change behavior modification model and how it applies to unhealthy thought patterns.  12:50 -1:00 Clinician led check-out. Clinician assessed for immediate needs, medication compliance and efficacy, and safety concerns  Therapist Response: Pt engaged in discussion and reports understanding of cognitive distortions and Catch-Challenge-Change model.   12:50 - 1:00: At check-out, patient rates her mood at a 7 on a scale of 1-10 with 10 being great.Pt states afternoon plans of taking a nap, watching tv, and cleaning.Patient demonstrates someprogress as evidenced by active focus on distractions to manage symptoms.Patient denies SI/HI/self-harm at the end of group.     Suicidal/Homicidal: Nowithout intent/plan  Plan: Pt will continue in PHP while working to decrease depression symptoms, increase ability to manage symptoms in a healthy manner, and increase level of daily functioning.   Diagnosis: Current severe episode of major depressive disorder without psychotic features without prior episode (HCC) [F32.2]    1. Current severe episode of major depressive disorder without psychotic features without prior episode (HCC)   2. Difficulty coping       [name], LCSW 01/12/2019

## 2019-01-12 NOTE — Psych (Addendum)
Virtual Visit via Video Note  I connected with Kayla Murphy on 01/10/19 at  9:00 AM EDT by a video enabled telemedicine application and verified that I am speaking with the correct person using two identifiers.   I discussed the limitations of evaluation and management by telemedicine and the availability of in person appointments. The patient expressed understanding and agreed to proceed.   I discussed the assessment and treatment plan with the patient. The patient was provided an opportunity to ask questions and all were answered. The patient agreed with the plan and demonstrated an understanding of the instructions.   The patient was advised to call back or seek an in-person evaluation if the symptoms worsen or if the condition fails to improve as anticipated.  Pt was provided 240 minutes of non-face-to-face time during this encounter.   Kayla Glass, LCSW    [facility] PHP THERAPIST PROGRESS NOTE  Kayla Murphy [id number]  Session Time: 9:00 - 10:00  Participation Level: Active  Behavioral Response: CasualAlertDepressed  Type of Therapy: Group Therapy  Treatment Goals addressed: Coping  Interventions: CBT, DBT, Supportive and Reframing  Summary: Clinician led check-in regarding current stressors and situation, and review of patient completed daily inventory. Clinician utilized active listening and empathetic response and validated patient emotions. Clinician facilitated discussion on issues that came up during check-in.  Therapist Response: Kayla Murphy is a 28 y.o. female who presents with depression symptoms. Patient arrived within time allowed and reports that she is feeling "in my feelings." Patient rates her mood at a 6 on a scale of 1-10 with 10 being great. Pt reports her and her partner broke up last night. Pt states they want to be friends but are taking a break first. Pt shares the way it ended was messier than she would have liked and she is struggling with the reasons  why the relationship ended. Pt reports her dentist appointment went well and she has a cavity which upsets her. Pt reports her mom tried to come over yesterday and pt resisted and later regretted it. Pt states she is glad she is in treatment when this is all happening because she does not know "where I would be right now" if she didn't know she was coming to group. Pt able to process. Pt engaged in discussion.        Session Time: 10:00 -11:00  Participation Level: Active  Behavioral Response: CasualAlertDepressed  Type of Therapy: Group Therapy, psychoeducation, psychotherapy  Treatment Goals addressed: Coping  Interventions: CBT, DBT, Solution Focused, Supportive and Reframing  Summary: Cln led discussion on how to use spirituality in recovery. Group members shared ways in which their spiritual beliefs interact with their lives and brainstormed ways to harness their spiritual framework to help them as they progress.   Therapist Response: Pt reports her faith is important to her and she feels as though she has not really made it as big of a part of her life as it could be. Pt shares she went to church and engaged in programs and "thought that was enough" but is now thinking she wants to engage deeper as a source of strength for her. Pt identifies reading scripture and seeking guidance from spiritual advisors as ways she can use her faith when she is struggling.         Session Time: 11:00 -12:00  Participation Level:Active  Behavioral Response:CasualAlertDepressed  Type of Therapy: Group Therapy, OT  Treatment Goals addressed: Coping  Interventions:Psychosocial skills training, Supportive,   Summary:  Occupational Therapy group  Therapist Response:Patient engaged in group. See OT note.         Session Time: 12:00- 1:00  Participation Level: Active  Behavioral Response: CasualAlertDepressed  Type of Therapy: Group Therapy,  Psychoeducation; Psychotherapy  Treatment Goals addressed: Coping  Interventions: CBT; Solution focused; Supportive; Reframing  Summary: Cln continued topic of cognitive distortions. Group utilized handout "Cognitive Distortions" to review different types of distortions and come up with examples from their own experiences to increase ability to "catch" when they are experiencing them.  12:50 -1:00 Clinician led check-out. Clinician assessed for immediate needs, medication compliance and efficacy, and safety concerns  Therapist Response: Pt engaged in discussion and reports understanding of cognitive distortions discussed. Pt reports fortune telling, mind reading, emotional reasoning, and black and white thinking as particularly problematic for her.   12:50 - 1:00: At check-out, patient rates her mood at a 7 on a scale of 1-10 with 10 being great.Pt states afternoon plans of cleaning up and using her cloring app.Patient demonstrates someprogress as evidenced by utilizing group support.Patient denies SI/HI/self-harm at the end of group.     Suicidal/Homicidal: Nowithout intent/plan  Plan: Pt will continue in PHP while working to decrease depression symptoms, increase ability to manage symptoms in a healthy manner, and increase level of daily functioning.   Diagnosis: Current severe episode of major depressive disorder without psychotic features without prior episode (HCC) [F32.2]    1. Current severe episode of major depressive disorder without psychotic features without prior episode [facility])       Kayla Guiles, LCSW 01/12/2019

## 2019-01-12 NOTE — Psych (Signed)
Virtual Visit via Video Note  I connected with Kayla Murphy on 01/09/19 at  9:00 AM EDT by a video enabled telemedicine application and verified that I am speaking with the correct person using two identifiers.   I discussed the limitations of evaluation and management by telemedicine and the availability of in person appointments. The patient expressed understanding and agreed to proceed.   I discussed the assessment and treatment plan with the patient. The patient was provided an opportunity to ask questions and all were answered. The patient agreed with the plan and demonstrated an understanding of the instructions.   The patient was advised to call back or seek an in-person evaluation if the symptoms worsen or if the condition fails to improve as anticipated.  Pt was provided 240 minutes of non-face-to-face time during this encounter.   [name], LCSW    [facility] PHP THERAPIST PROGRESS NOTE  Kayla Murphy [id number]  Session Time: 9:00 - 10:00  Participation Level: Active  Behavioral Response: CasualAlertDepressed  Type of Therapy: Group Therapy  Treatment Goals addressed: Coping  Interventions: CBT, DBT, Supportive and Reframing  Summary: Clinician led check-in regarding current stressors and situation, and review of patient completed daily inventory. Clinician utilized active listening and empathetic response and validated patient emotions. Clinician facilitated processing group on pertinent issues.   Therapist Response: Kayla Murphy is a 28 y.o. female who presents with depression symptoms. Patient arrived within time allowed and reports that she is feeling "okay." Patient rates her mood at a 8 on a scale of 1-10 with 10 being great. Pt reports yesterday went better than expected. Pt states she cooked and tidied the house, and her mom and partner came over in the evening. Pt reports they got along and it went well which is not always the case. Pt states she felt the "let me  just have my peace" last night. Pt reports struggling with forgiveness. Pt able to process. Patient engaged in discussion.      Session Time: 10:00-11:00  Participation Level:Active  Behavioral Response:CasualAlertDepressed  Type of Therapy: Group Therapy, psychoeducation, psychotherapy  Treatment Goals addressed: Coping  Interventions:CBT, DBT, Solution Focused, Supportive and Reframing  Summary:Cln led discussion on therapeutic distraction. Group discussed how therapeutic distraction differs from "doing nothing" and/or "wasting time." Cln and group brainstormed times distraction can be helpful and different ways it can be practiced.   Therapist Response: Pt reports understanding of therapeutic distraction and identifies this can be helpful for her when she is "overthinking." Pt reports she can use her coloring app or talk to someone.      Session Time: 11:00 -12:00  Participation Level:Active  Behavioral Response:CasualAlertDepressed  Type of Therapy: Group Therapy, psychotherapy  Treatment Goals addressed: Coping  Interventions:Strengths based, reframing, Supportive,   Summary:Spiritual Care group  Therapist Response: Patient engaged in group. See chaplain note.       Session Time: 12:00- 1:00  Participation Level:Active  Behavioral Response:CasualAlertDepressed  Type of Therapy: Group Therapy, Psychoeducation  Treatment Goals addressed: Coping  Interventions:relaxation training; Supportive; Reframing  Summary:12:00- 12:50: Relaxation group: Cln led group focused on retraining the body's response to stress.  12:50 -1:00 Clinician led check-out. Clinician assessed for immediate needs, medication compliance and efficacy, and safety concerns   Therapist Response:12:00 - 12:50:Patient engaged in activity and discussion. 12:50 - 1:00: At check-out, patientrates hermood at a 9on a scale of 1-10 with 10  being great.Pt states afternoon plans of napping, going to the dentist, and having a movie night with  her partner.Patient demonstratessomeprogress as evidenced byrecognizing boundaries.Patient denies SI/HI/self-harm at the end of group.      Suicidal/Homicidal: Nowithout intent/plan  Plan: Pt will continue in PHP while working to decrease depression symptoms, increase ability to manage symptoms in a healthy manner, and increase level of daily functioning.   Diagnosis: Current severe episode of major depressive disorder without psychotic features without prior episode ([place]) [F32.2]    1. Current severe episode of major depressive disorder without psychotic features without prior episode [facility])       [name], LCSW 01/12/2019

## 2019-01-12 NOTE — Psych (Signed)
Virtual Visit via Video Note  I connected with Kayla Murphy on 01/11/19 at  9:00 AM EDT by a video enabled telemedicine application and verified that I am speaking with the correct person using two identifiers.   I discussed the limitations of evaluation and management by telemedicine and the availability of in person appointments. The patient expressed understanding and agreed to proceed.   I discussed the assessment and treatment plan with the patient. The patient was provided an opportunity to ask questions and all were answered. The patient agreed with the plan and demonstrated an understanding of the instructions.   The patient was advised to call back or seek an in-person evaluation if the symptoms worsen or if the condition fails to improve as anticipated.  Pt was provided 240 minutes of non-face-to-face time during this encounter.   [name], LCSW    [facility] PHP THERAPIST PROGRESS NOTE  Kayla Murphy [id number]  Session Time: 9:00 - 10:00  Participation Level: Minimal  Behavioral Response: CasualAlertDepressed  Type of Therapy: Group Therapy  Treatment Goals addressed: Coping  Interventions: CBT, DBT, Supportive and Reframing  Summary: Clinician led check-in regarding current stressors and situation, and review of patient completed daily inventory. Clinician utilized active listening and empathetic response and validated patient emotions. Clinician facilitated discussion on issues that came up during check-in.  Therapist Response: Kayla Murphy is a 28 y.o. female who presents with depression symptoms. Patient arrived late and reports that she is feeling "okay." Patient rates her mood at a 8 on a scale of 1-10 with 10 being great. Pt reports her afternoon was "fine" and that she is feeling stressed due to work calling her and feeling pressure about her absence. Pt appears distracted and minimally engages in group.         Session Time: 10:00 -11:00  Participation  Level: Minimal  Behavioral Response: CasualAlertDepressed  Type of Therapy: Group Therapy, psychoeducation, psychotherapy  Treatment Goals addressed: Coping  Interventions: CBT, DBT, Solution Focused, Supportive and Reframing  Summary: Cln led discussion on feelings and how to view them as an experience versus a meaning. Cln incorporated mindfulness techniques and dialectics in discussion of ways to separate ourselves from our feelings and change perspective of the way we experience them.   Therapist Response: Pt reports understanding of feelings as an experience rather than a truth requiring action. Pt minimally engaged in discussion.         Session Time: 11:00- 12:00  Participation Level: Minimal   Behavioral Response: CasualAlertDepressed  Type of Therapy: Group Therapy, Psychoeducation; Psychotherapy  Treatment Goals addressed: Coping  Interventions: CBT; Solution focused; Supportive; Reframing  Summary: Cln continued topic of cognitive distortions. Group reviewed previous days discussions and cln introduced "Challenge" portion of the Catch-Challenge-Change model. Cln utilized Web designer Questions" and discussed how they can help change perspective and use logic to battle distorted thinking.   Therapist Response: Pt minimally engaged in discussion and reports understanding of topic.       Session Time: 12:00 -1:00  Participation Level:Active  Behavioral Response:CasualAlertDepressed  Type of Therapy: Group Therapy, OT  Treatment Goals addressed: Coping  Interventions:Psychosocial skills training, Supportive,   Summary:12:00 - 12:50:Occupational Therapy group 12:50 -1:00 Clinician led check-out. Clinician assessed for immediate needs, medication compliance and efficacy, and safety concerns   Therapist Response:12:00 - 12:50:Patient engaged in group. See OT note.  12:50 - 1:00: At check-out, patient rates her mood at  a 9 on a scale of 1-10 with 10 being great.Pt states  afternoon plans of packing and leaving for [facility] for a school project.Patient demonstrates limited progress as evidenced by decreased engagment.Patient denies SI/HI/self-harm at the end of group.     Suicidal/Homicidal: Nowithout intent/plan  Plan: Pt will continue in PHP while working to decrease depression symptoms, increase ability to manage symptoms in a healthy manner, and increase level of daily functioning.   Diagnosis: Current severe episode of major depressive disorder without psychotic features without prior episode (HCC) [F32.2]    1. Current severe episode of major depressive disorder without psychotic features without prior episode [facility])       [name], LCSW 01/12/2019

## 2019-01-14 ENCOUNTER — Other Ambulatory Visit (HOSPITAL_COMMUNITY): Payer: BC Managed Care – PPO | Admitting: Licensed Clinical Social Worker

## 2019-01-14 DIAGNOSIS — F322 Major depressive disorder, single episode, severe without psychotic features: Secondary | ICD-10-CM | POA: Diagnosis not present

## 2019-01-15 ENCOUNTER — Other Ambulatory Visit: Payer: Self-pay

## 2019-01-15 ENCOUNTER — Encounter (HOSPITAL_COMMUNITY): Payer: Self-pay | Admitting: Family

## 2019-01-15 ENCOUNTER — Other Ambulatory Visit (HOSPITAL_COMMUNITY): Payer: BC Managed Care – PPO | Admitting: Licensed Clinical Social Worker

## 2019-01-15 ENCOUNTER — Telehealth (HOSPITAL_COMMUNITY): Payer: Self-pay | Admitting: Psychiatry

## 2019-01-15 DIAGNOSIS — F322 Major depressive disorder, single episode, severe without psychotic features: Secondary | ICD-10-CM

## 2019-01-15 NOTE — Progress Notes (Signed)
Virtual Visit via Telephone Note  I connected with Kayla Murphy on 01/15/19 at  9:00 AM EDT by telephone and verified that I am speaking with the correct person using two identifiers.   I discussed the limitations, risks, security and privacy concerns of performing an evaluation and management service by telephone and the availability of in person appointments. I also discussed with the patient that there may be a patient responsible charge related to this service. The patient expressed understanding and agreed to proceed.   I discussed the assessment and treatment plan with the patient. The patient was provided an opportunity to ask questions and all were answered. The patient agreed with the plan and demonstrated an understanding of the instructions.   The patient was advised to call back or seek an in-person evaluation if the symptoms worsen or if the condition fails to improve as anticipated.  I provided 2minutes of non-face-to-face time during this encounter.   Derrill Center, NP   [facility] Intensive Outpatient Program Discharge Summary  Kayla Murphy [id number]  Admission date: 01/01/2019 Discharge date: 01/15/2019  Reason for admission: Per admission assessment note: Kayla Pintor is a 28 y.o. African American female presents with depression, poor concentration and worsening anxiety. Reported " life has become too overwhelming."  Reports she has noted she has become more tearful and isolative.  Reports a previous inpatient admission for suicidal attempt.  States she she is starting to feel the same as she did when she admitted herself into [facility].  States she did not keep follow-up appointment with psychiatry or therapist at that time.  States she was prescribed a medication Wellbutrin however did not continue.  States her primary care provider has recently restarted her on Wellbutrin she reports she does not feel as if the medication is helping with her  symptoms.  Has intermittent passive suicidal ideations.  Currently denying plan or intent.  Patient was enrolled in partial psychiatric program on 01/01/19.   Chemical Use History: Denied  Family of Origin Issues: Ongoing, Kayla reported she continues to work on her relationship with her mother. Stated overall her family is supportive.   Progress in Program Toward Treatment Goals: Ongoing, n.p.o. attended and participated with daily group session with active and engaged participation.  Patient was initiated on Prozac 10 mg.  She reports taking and tolerating medications well. Continue to report low energy and mild anxiety with regards to returning back to work.  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  We will continue to monitor for safety.  Follow-up with medication regiment.  Support, encouragement and reassurance was provided.  Progress (rationale): Patient is stepping down to intensive outpatient programming   Take all medications as prescribed. Keep all follow-up appointments as scheduled.  Do not consume alcohol or use illegal drugs while on prescription medications. Report any adverse effects from your medications to your primary care provider promptly.  In the event of recurrent symptoms or worsening symptoms, call 911, a crisis hotline, or go to the nearest emergency department for evaluation.   Derrill Center, NP 01/15/2019

## 2019-01-15 NOTE — Telephone Encounter (Signed)
D:  Patient will be transitioning to [facility]-IOP from [facility] tomorrow.  A:  Placed call to orient pt and answer her questions.  Pt will start tomorrow at 9 a.m.  R:  Patient receptive.

## 2019-01-15 NOTE — Progress Notes (Signed)
Spoke with patient via Webex video call, used 2 identifiers to correctly identify patient. States that she starts IOP tomorrow. She has enjoyed group and is now getting more tasks done and accomplished throughout her day. She finished 3 of 5 things yesterday that she wanted to do. She is able to process her thinking before she jumps from start to finish. Wellbutrin and Prozac are working well for her and noticed since adding Prozac that she can do a lot more during the day. No longer having night sweats.On scale 1-10 as 10 being worst she rates depression at 7 and anxiety at 9. Denies SI/HI or AV hallucinations. PHQ9=13. No other issues or complaints.

## 2019-01-16 ENCOUNTER — Other Ambulatory Visit (HOSPITAL_COMMUNITY): Payer: BC Managed Care – PPO

## 2019-01-16 ENCOUNTER — Other Ambulatory Visit (HOSPITAL_COMMUNITY): Payer: BC Managed Care – PPO | Admitting: Family

## 2019-01-16 ENCOUNTER — Other Ambulatory Visit: Payer: Self-pay

## 2019-01-16 ENCOUNTER — Encounter (HOSPITAL_COMMUNITY): Payer: Self-pay | Admitting: Psychiatry

## 2019-01-16 DIAGNOSIS — F322 Major depressive disorder, single episode, severe without psychotic features: Secondary | ICD-10-CM | POA: Diagnosis not present

## 2019-01-16 NOTE — Progress Notes (Signed)
Virtual Visit via Telephone Note  I connected with Kayla Murphy on 01/16/19 at  9:00 AM EDT by telephone and verified that I am speaking with the correct person using two identifiers.   I discussed the limitations, risks, security and privacy concerns of performing an evaluation and management service by telephone and the availability of in person appointments. I also discussed with the patient that there may be a patient responsible charge related to this service. The patient expressed understanding and agreed to proceed.   I discussed the assessment and treatment plan with the patient. The patient was provided an opportunity to ask questions and all were answered. The patient agreed with the plan and demonstrated an understanding of the instructions.   The patient was advised to call back or seek an in-person evaluation if the symptoms worsen or if the condition fails to improve as anticipated.  I provided 30 minutes of non-face-to-face time during this encounter.   Derrill Center, NP    Psychiatric Initial Adult Assessment   Patient Identification: Kayla Murphy MRN:  [id number] Date of Evaluation:  01/16/2019 Referral Source: PHP Chief Complaint:   Chief Complaint    Anxiety; Depression; Stress     Visit Diagnosis:    ICD-10-CM   1. Current severe episode of major depressive disorder without psychotic features without prior episode ([place])  F32.2     History of Present Illness:  Per assessment note: Kayla Murphy a 28 y.o. African Americanfemale presents with depression, poor concentration and worsening anxiety. Reported "life has become too overwhelming."Reports she has noted she has become more tearful andisolative.Reports a previous inpatient admission for suicidal attempt. States she she is starting to feel the same as she did when she admitted herself into [facility]. States she did not keep follow-up appointment with psychiatry or therapist at that time. States  she was prescribed a medication Wellbutrin however did not continue. States her primary care provider has recently restarted her on Wellbutrin she reports she does not feel as if the medication is helping with her symptoms. Has intermittent passive suicidal ideations. Currently denying plan or intent.   Evaluation: Kayla stepping down from partial hospitalization programming she was admitted to a partial due to passive suicidal ideations, worsening depression and anxiety symptoms.  Patient was prescribed Wellbutrin by her primary care provider.  Initiated on Prozac 10 doing partial hospitalization program.  Patient reports taking and tolerating medications well.  As she reports she does not feel as if her current medication regimen and it was helping with her symptoms.  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  Reports overall her mood has been stable.  She is denying suicidal or homicidal ideations.  She denies auditory or visual hallucinations.  Does report interrupted sleep and restless nights.  We will continue to monitor symptoms.  Patient to be admitted into intensive outpatient programming on 01/16/2019    Associated Signs/Symptoms: Depression Symptoms:  depressed mood, feelings of worthlessness/guilt, difficulty concentrating, anxiety, (Hypo) Manic Symptoms:  Distractibility, Irritable Mood, Anxiety Symptoms:  Excessive Worry, Psychotic Symptoms:  Hallucinations: None PTSD Symptoms: Avoidance:  Decreased Interest/Participation  Past Psychiatric History:   Previous Psychotropic Medications: No   Substance Abuse History in the last 12 months:  No.  Consequences of Substance Abuse: NA  Past Medical History:  Past Medical History:  Diagnosis Date  . Anxiety   . Depression   . Herpes    Valtrex  on outbreaks  . Medical history non-contributory     Past Surgical History:  Procedure Laterality Date   . NO PAST SURGERIES      Family Psychiatric History:   Family History:  Family History  Problem Relation Age of Onset  . Depression Mother     Social History:   Social History   Socioeconomic History  . Marital status: Single    Spouse name: Not on file  . Number of children: 0  . Years of education: Not on file  . Highest education level: Not on file  Occupational History  . Not on file  Social Needs  . Financial resource strain: Very hard  . Food insecurity    Worry: Often true    Inability: Often true  . Transportation needs    Medical: No    Non-medical: No  Tobacco Use  . Smoking status: Never Smoker  . Smokeless tobacco: Never Used  Substance and Sexual Activity  . Alcohol use: Yes    Comment: socially  . Drug use: No  . Sexual activity: Yes    Birth control/protection: Condom  Lifestyle  . Physical activity    Days per week: 0 days    Minutes per session: Not on file  . Stress: Very much  Relationships  . Social connections    Talks on phone: More than three times a week    Gets together: More than three times a week    Attends religious service: More than 4 times per year    Active member of club or organization: Yes    Attends meetings of clubs or organizations: More than 4 times per year    Relationship status: Never married  Other Topics Concern  . Not on file  Social History Narrative  . Not on file    Additional Social History:   Allergies:   Allergies  Allergen Reactions  . Shellfish Allergy Anaphylaxis    Metabolic Disorder Labs: No results found for: HGBA1C, MPG No results found for: PROLACTIN No results found for: CHOL, TRIG, HDL, CHOLHDL, VLDL, LDLCALC No results found for: TSH  Therapeutic Level Labs: No results found for: LITHIUM No results found for: CBMZ No results found for: VALPROATE  Current Medications: Current Outpatient Medications  Medication Sig Dispense Refill  . acetaminophen (TYLENOL) 500 MG tablet Take  500 mg by mouth every 6 (six) hours as needed for mild pain or fever.    . Acetaminophen-DM (COLD & COUGH DAYTIME PO) Take 2 tablets by mouth every 8 (eight) hours as needed (cold and cough).    Marland Kitchen. buPROPion (WELLBUTRIN XL) 150 MG 24 hr tablet Take 150 mg by mouth every morning.    Marland Kitchen. EPINEPHrine (EPIPEN 2-PAK) 0.3 mg/0.3 mL IJ SOAJ injection Inject 0.3 mg into the muscle once.    . ferrous sulfate 325 (65 FE) MG tablet Take 325 mg by mouth every morning.    Marland Kitchen. FLUoxetine (PROZAC) 10 MG capsule Take 1 capsule (10 mg total) by mouth daily. 30 capsule 2  . ondansetron (ZOFRAN ODT) 4 MG disintegrating tablet Take 1 tablet (4 mg total) by mouth every 8 (eight) hours as needed for nausea or vomiting. 20 tablet 0  . traZODone (DESYREL) 50 MG tablet Take 1 tablet (50 mg total) by mouth at bedtime. 30 tablet 0  . valACYclovir (VALTREX) 1000 MG tablet Take 1,000 mg by mouth as needed.     No current facility-administered medications for this visit.     Musculoskeletal:  Psychiatric Specialty Exam: ROS  There were no vitals taken for this visit.There is no height or weight on file to calculate BMI.  General Appearance: Casual  Eye Contact:  Fair  Speech:  Clear and Coherent  Volume:  Normal  Mood:  Anxious and Depressed  Affect:  Congruent  Thought Process:  Coherent  Orientation:  Full (Time, Place, and Person)  Thought Content:  WDL, Logical and Rumination  Suicidal Thoughts:  No  Homicidal Thoughts:  No  Memory:  Immediate;   Fair Recent;   Fair  Judgement:  Fair  Insight:  Fair  Psychomotor Activity:  Normal  Concentration:  Concentration: Fair  Recall:  [employer] of Knowledge:Fair  Language: Fair  Akathisia:  No  Handed:    AIMS (if indicated):   Assets:  Communication Skills Desire for Improvement Resilience Social Support  ADL's:  Intact  Cognition: WNL  Sleep:  Poor   Screenings: PHQ2-9     Counselor from 01/15/2019 in BEHAVIORAL HEALTH PARTIAL HOSPITALIZATION PROGRAM  Counselor from 01/01/2019 in BEHAVIORAL HEALTH PARTIAL HOSPITALIZATION PROGRAM  PHQ-2 Total Score  3  6  PHQ-9 Total Score  13  21      Assessment and Plan: Admitted to Intensive Outpatient Programming Continue medications as directed  Treatment plan was reviewed and agreed upon by NPT Geoffrey Hynes and patient [place] Diamant need for continued group services   Oneta Rack, NP 10/28/20201:32 PM

## 2019-01-16 NOTE — Progress Notes (Signed)
Virtual Visit via Video Note  I connected with Kayla Murphy on 01/16/19 at  9:00 AM EDT by a video enabled telemedicine application and verified that I am speaking with the correct person using two identifiers.  Location: Patient: Kayla Murphy Provider: Lise Auer, LCSW   I discussed the limitations of evaluation and management by telemedicine and the availability of in person appointments. The patient expressed understanding and agreed to proceed.  History of Present Illness: MDD   Observations/Objective: Case Manager checked in with all participants to review discharge dates, insurance authorizations, work-related documents and needs for the treatment team. Counselor processed current mood and functioning and discussed how participants spent their time since last session and if skills were applied. Today is Kayla Murphy's first sessio of group. She shared about her presenting problems, about her current living and work situation, what she hopes to get out of group and other important information. She would like to gain better coping skills to handle work stressors, interpersonal relationships and dealing with her traumatic past. She presented with moderate anxiety and depression.   Counselor engaged group members in an ice breaker, since we have a new group member, to list, then share their favorite aspects of Fall. Group members shared their preferences and we connected it to grounding activities presented in the past. Counselor engaged the group in an activity to examine their internal and external emotional and cognitive experiences, through the Inside Out Mask intervention. Group members were given the instructions and allowed time to work on their visual expressions. We will complete activity tomorrow and process reflections.   Counselor introduced group members to Demetra Shiner from [employer] [facility] [employer], as she presented on the topic of Self-Care and [employer]. Group members  engaged in discussion, asked questions and identified takeaways/personal goals.   Counselor wrapped up the session by having each group member share their plans for the afternoon. Kayla plans to meet with a friend today to do lunch and plans to spend time on her budget and finances.    Assessment and Plan: Counselor recommends that patient remains in IOP treatment to better manage mental health symptoms and continue to address treatment plan goals. Counselor recommends adherence to crisis/safety plan, taking medications as prescribed and following up with medical professionals if any issues arise.   Follow Up Instructions: Counselor will send Webex link for next session.    I discussed the assessment and treatment plan with the patient. The patient was provided an opportunity to ask questions and all were answered. The patient agreed with the plan and demonstrated an understanding of the instructions.   The patient was advised to call back or seek an in-person evaluation if the symptoms worsen or if the condition fails to improve as anticipated.  I provided 180 minutes of non-face-to-face time during this encounter.   Lise Auer, LCSW

## 2019-01-16 NOTE — Progress Notes (Signed)
  Virtual Visit via Video Note  I connected with Niger Eckstrom on 01/16/19 at 0800 by a video enabled telemedicine application and verified that I am speaking with the correct person using two identifiers. I discussed the limitations of evaluation and management by telemedicine and the availability of in person appointments. The patient expressed understanding and agreed to proceed. I discussed the assessment and treatment plan with the patient. The patient was provided an opportunity to ask questions and all were answered. The patient agreed with the plan and demonstrated an understanding of the instructions.  The patient was advised to call back or seek an in-person evaluation if the symptoms worsen or if the condition fails to improve as anticipated.  I provided 20 minutes of non-face-to-face time during this encounter.   As per previous CCA states: Pt reports to PHP per PCP. Pt denies current providers for mental health. Pt voluntarily went to [place] in 2018 due to depression and "tired of masking the feelings." Pt did not follow-up with providers afterwards. Pt denies any other mental health treatment. 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 Pt reports she lost her job from last year due to "jealousy" reasons. Pt reports others will ask her if she needs help but pt is unsure of what questions to ask so no help is given. Pt reports lack of motivation to get materials ready for class and handle other responsibilities. 2) School: Pt is a Designer, jewellery at [employer]. Pt reports her professor over the summer treated her unfairly and still has not given her the grades for submitted work. Pt reports current class is not going well due to lack of motivation. 3) Relationship struggles: Pt is bisexual and in a "kind of relationship" with a female friend. Pt reports female friend cares about what others think "too much"  and "the church doesn't accept it." Pt shares the female friend has a boyfriend that pt "tolerates" because "what else am I supposed to do?" Pt reports some passive SI; denies HI/AVH. Pt is staying with mother at this time for comfort and support. Pt is written out of work per PCP until 10/14.  Pt transitioned to MH-IOP from [place] today.  Reports continued depressive and anxiety symptoms with passive SI.  Denies a plan or intent.  Able to contract for safety.  Denies A/V hallucinations or HI.  Pt states that PHP was helpful and is looking forward to attending MH-IOP.  "I am nervous about being in another group with new people, but I am looking forward to seeing what it brings."  Pt is still learning ways to cope with the above stressors.  A:  Oriented pt to virtual MH-IOP.  Answered all questions.  Pt gave verbal consent for treatment, to release chart information to referred providers and to complete any forms if needed.  Pt also gave consent for attending group virtually d/t COVID-19 social distancing restrictions.  Encouraged support groups thru [facility].  Will refer pt to a psychiatrist and therapist.  R:  Patient receptive.    Dellia Nims, M.Ed, CNA

## 2019-01-17 ENCOUNTER — Other Ambulatory Visit (HOSPITAL_COMMUNITY): Payer: BC Managed Care – PPO | Admitting: Psychiatry

## 2019-01-17 ENCOUNTER — Other Ambulatory Visit: Payer: Self-pay

## 2019-01-17 ENCOUNTER — Encounter (HOSPITAL_COMMUNITY): Payer: Self-pay | Admitting: Psychiatry

## 2019-01-17 DIAGNOSIS — R4589 Other symptoms and signs involving emotional state: Secondary | ICD-10-CM

## 2019-01-17 DIAGNOSIS — F322 Major depressive disorder, single episode, severe without psychotic features: Secondary | ICD-10-CM

## 2019-01-17 NOTE — Progress Notes (Signed)
Virtual Visit via Video Note  I connected with Kayla Murphy on 01/17/19 at  9:00 AM EDT by a video enabled telemedicine application and verified that I am speaking with the correct person using two identifiers.  Location: Patient: Kayla Murphy Provider: Lise Auer, LCSW   I discussed the limitations of evaluation and management by telemedicine and the availability of in person appointments. The patient expressed understanding and agreed to proceed.  History of Present Illness: MDD and Difficulty Coping   Observations/Objective: Case Manager checked in with all participants to review discharge dates, insurance authorizations, work-related documents and needs for the treatment team. Counselor processed current mood and functioning and discussed how participants spent their time since last session and if skills were applied. Kayla stated that she attempted to relax yesterday and enjoyed the socialization of having a friend over. Unfortunately, her mother is experiencing medical issues, and she has been asked to step in to help. Kayla reported being concerned and distressed in the matter. Kayla presented with high anxiety and moderate depression.   Counselor engaged the group in taking time to finish their [place], then each group members shared about what they created and the meaning behind the visuals. Kayla shared about her work verbally sharing about the internal side of  sadness, overwhelming expectations and roles, as well as past traumas. Her external looks put together, "glitz and glam" and happy. Counselor allowed the graduating group members to choose a guided imagery, called Finding Your Authentic Self, which included progressive muscle relaxation, deep breathing, visualizations and positive affirmations. Group members shared about their experiences. Kayla had an emergency and had to leave during the practice, so we were unable to assess her outcomes or plans for the day.    Assessment and Plan: Counselor recommends that patient remains in IOP treatment to better manage mental health symptoms and continue to address treatment plan goals. Counselor recommends adherence to crisis/safety plan, taking medications as prescribed and following up with medical professionals if any issues arise.   Follow Up Instructions: Counselor will send Webex link for next session.    I discussed the assessment and treatment plan with the patient. The patient was provided an opportunity to ask questions and all were answered. The patient agreed with the plan and demonstrated an understanding of the instructions.   The patient was advised to call back or seek an in-person evaluation if the symptoms worsen or if the condition fails to improve as anticipated.  I provided 140 minutes of non-face-to-face time during this encounter.   Lise Auer, LCSW

## 2019-01-18 ENCOUNTER — Other Ambulatory Visit: Payer: Self-pay

## 2019-01-18 ENCOUNTER — Other Ambulatory Visit (HOSPITAL_COMMUNITY): Payer: BC Managed Care – PPO | Admitting: Psychiatry

## 2019-01-18 ENCOUNTER — Encounter (HOSPITAL_COMMUNITY): Payer: Self-pay

## 2019-01-18 DIAGNOSIS — F322 Major depressive disorder, single episode, severe without psychotic features: Secondary | ICD-10-CM

## 2019-01-18 NOTE — Progress Notes (Signed)
Virtual Visit via Video Note  I connected with Kayla Murphy on 01/18/19 at  9:00 AM EDT by a video enabled telemedicine application and verified that I am speaking with the correct person using two identifiers.  Location: Patient: Kayla Murphy Provider: Lise Auer, LCSW   I discussed the limitations of evaluation and management by telemedicine and the availability of in person appointments. The patient expressed understanding and agreed to proceed.  History of Present Illness: MDD   Observations/Objective: Case Manager checked in with all participants to review discharge dates, insurance authorizations, work-related documents and needs for the treatment team. Counselor processed current mood and functioning and discussed how participants spent their time since last session and if skills were applied. Kayla shared updates about her mothers health emergency yesterday. She processed the history and relationship between her and her mother with the group. Group members shared words of encouragement with her. Kayla stated that she was trying to get reoriented from the chaos and concern she experiences yesterday. Kayla presented with moderate anxiety and depression.   Counselor engaged the group in an ice breaker activity, sharing if their personality is more connected with "tricking" or "treating". Group members shred their reflections on the question. 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 Counselor presented on the topic of "Self-Compassion", first asking them to write down thoughts about self-compassion then reviewing aspects of a research article by Cristie Hem, Ph. D as a group. Group members shared in the open discussion on the topic offering support and implementation strategies with others. Kayla discussed her problem with hiding feelings, stuffing things down, then  having mental health breakdowns. She would like to extend more self-compassion in her life, being able to label, express and process feelings better.   Counselor engaged the group in the Self-Soothing Exercises which accompanied the article. Group members shared which self-soothing technique was most impactful for them. Kayla prefers her hand over her heart and to make positive affirmations in her mind while self-soothing.  Counselor ended group by group members sharing their plans for the day and weekend, as well as a self-care practice they can engage in. Kayla plans to spend time with her family this afternoon and to continue caring for her mother. She stated that she is enjoying group so far and will reflect on how to start implementing coping skills and strategies.   Assessment and Plan: Counselor recommends that patient remains in IOP treatment to better manage mental health symptoms and continue to address treatment plan goals. Counselor recommends adherence to crisis/safety plan, taking medications as prescribed and following up with medical professionals if any issues arise.   Follow Up Instructions: Counselor will send Webex link for next session.    I discussed the assessment and treatment plan with the patient. The patient was provided an opportunity to ask questions and all were answered. The patient agreed with the plan and demonstrated an understanding of the instructions.   The patient was advised to call back or seek an in-person evaluation if the symptoms worsen or if the condition fails to improve as anticipated.  I provided 180 minutes of non-face-to-face time during this encounter.   Lise Auer, LCSW

## 2019-01-21 ENCOUNTER — Encounter (HOSPITAL_COMMUNITY): Payer: Self-pay

## 2019-01-21 ENCOUNTER — Other Ambulatory Visit (HOSPITAL_COMMUNITY): Payer: BC Managed Care – PPO | Attending: Family | Admitting: Psychiatry

## 2019-01-21 ENCOUNTER — Other Ambulatory Visit: Payer: Self-pay

## 2019-01-21 DIAGNOSIS — F332 Major depressive disorder, recurrent severe without psychotic features: Secondary | ICD-10-CM | POA: Diagnosis not present

## 2019-01-21 DIAGNOSIS — R45851 Suicidal ideations: Secondary | ICD-10-CM | POA: Diagnosis not present

## 2019-01-21 DIAGNOSIS — F419 Anxiety disorder, unspecified: Secondary | ICD-10-CM | POA: Insufficient documentation

## 2019-01-21 DIAGNOSIS — F322 Major depressive disorder, single episode, severe without psychotic features: Secondary | ICD-10-CM | POA: Diagnosis present

## 2019-01-21 NOTE — Progress Notes (Signed)
Virtual Visit via Video Note  I connected with Kayla Murphy on 01/21/19 at  9:00 AM EST by a video enabled telemedicine application and verified that I am speaking with the correct person using two identifiers.  Location: Patient: Kayla Murphy Provider: Lise Auer, LCSW   I discussed the limitations of evaluation and management by telemedicine and the availability of in person appointments. The patient expressed understanding and agreed to proceed.  History of Present Illness: MDD   Observations/Objective: Case Manager checked in with all participants to review discharge dates, insurance authorizations, work-related documents and needs for the treatment team. Counselor processed current mood and functioning and discussed how participants spent their time since last session and if skills were applied. Kayla shared that she was tired physically but mentally and emotionally energized. 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 She opened up to the group about a friend issue she needed feedback about. Kayla explored the ways she hopes to address the issues and shared her challenges. Kayla presented with moderate anxiety and moderate depression.  Counselor allowed the group to process thoughts and feelings about the upcoming election, as well as identifying coping strategies to manage reactions and interactions with others in the coming days. Kayla shared that she has concerns and how she plans to cope with the outcomes. Counselor provided the group with psychoeducation on Cognitive Distortions, sharing a video and covering 2 handouts. Group members each shared which distortions they most experience. Kayla identified that she engages in blaming, personalization, and jumping to conclusions.  Counselor provided group with strategies on challenging distorted thinking.   Counselor checked in with all group members to determine plan for self-care and productivity activities for the  afternoon. Kayla plans to make a to do list, complete chores at her home, and run errands.   Assessment and Plan: Counselor recommends that patient remains in IOP treatment to better manage mental health symptoms and continue to address treatment plan goals. Counselor recommends adherence to crisis/safety plan, taking medications as prescribed and following up with medical professionals if any issues arise.   Follow Up Instructions: Counselor will send Webex link for next session.    I discussed the assessment and treatment plan with the patient. The patient was provided an opportunity to ask questions and all were answered. The patient agreed with the plan and demonstrated an understanding of the instructions.   The patient was advised to call back or seek an in-person evaluation if the symptoms worsen or if the condition fails to improve as anticipated.  I provided 180 minutes of non-face-to-face time during this encounter.   Lise Auer, LCSW

## 2019-01-22 ENCOUNTER — Other Ambulatory Visit (HOSPITAL_COMMUNITY): Payer: BC Managed Care – PPO | Admitting: Psychiatry

## 2019-01-22 ENCOUNTER — Telehealth (HOSPITAL_COMMUNITY): Payer: Self-pay | Admitting: Psychiatry

## 2019-01-22 ENCOUNTER — Other Ambulatory Visit: Payer: Self-pay

## 2019-01-23 ENCOUNTER — Other Ambulatory Visit (HOSPITAL_COMMUNITY): Payer: BC Managed Care – PPO | Admitting: Psychiatry

## 2019-01-23 ENCOUNTER — Other Ambulatory Visit: Payer: Self-pay

## 2019-01-23 ENCOUNTER — Telehealth (HOSPITAL_COMMUNITY): Payer: Self-pay | Admitting: Psychiatry

## 2019-01-23 ENCOUNTER — Encounter (HOSPITAL_COMMUNITY): Payer: Self-pay

## 2019-01-23 DIAGNOSIS — F322 Major depressive disorder, single episode, severe without psychotic features: Secondary | ICD-10-CM

## 2019-01-23 DIAGNOSIS — F332 Major depressive disorder, recurrent severe without psychotic features: Secondary | ICD-10-CM | POA: Diagnosis not present

## 2019-01-23 NOTE — Telephone Encounter (Signed)
D:  According to group leader Romelle Starcher Morris, LCSW); pt logged on for group this morning, but logged off at 10:15 and never logged back on.  A:  Placed call to pt, but there was no answer and vm isn't set up.  Will discuss with treatment team.  If pt continues logging on and off and not being compliant; will contact short term disability company and recommend pt return back to work sooner.

## 2019-01-23 NOTE — Progress Notes (Signed)
Virtual Visit via Video Note  I connected with Kayla Murphy on 01/23/19 at  9:00 AM EST by a video enabled telemedicine application and verified that I am speaking with the correct person using two identifiers.  Location: Patient: Kayla Murphy Provider: Lise Auer, LCSW   I discussed the limitations of evaluation and management by telemedicine and the availability of in person appointments. The patient expressed understanding and agreed to proceed.  History of Present Illness: MDD   Observations/Objective: Case Manager checked in with all participants to review discharge dates, insurance authorizations, work-related documents and needs for the treatment team. Counselor introduced our guest speaker, Einar Grad, [facility] Pharmacist, who shared about psychiatric medications, side effects, treatment considerations and how to communicate with medical professionals. Each group member asked questions and shared medication concerns. Kayla asked specific questions about her depression medications and their side effects.  Counselor prompted group members to reference a worksheet called, "Body Scan" to jot down questions and concerns about their physical health in preparation for their upcoming appointments with medical professionals. Counselor allowed each participant to share their ideas with the group. She would like to follow up with medical provider about medications for concentration and focus. Counselor encouraged routine medical check-ups, preparing for appointments, following up with recommendations and seeking specialist if needed.   Counselor engaged the group in a therapeutic activity where they created an ECOMAP to depict current relationships, connections, support system, stressors and where they are expending their energy. Group members shared their diagrams and reflections with the group. Kayla was unable to complete this activity due to Internet connectivity issues.  Counselor checked in with  group members to determine plans for the afternoon and to encourage self-care and implementation of coping skills. Kayla plans to spend time with family today.  Assessment and Plan: Counselor recommends that patient remains in IOP treatment to better manage mental health symptoms and continue to address treatment plan goals. Counselor recommends adherence to crisis/safety plan, taking medications as prescribed and following up with medical professionals if any issues arise.   Follow Up Instructions: Counselor will send Webex link for next session.    I discussed the assessment and treatment plan with the patient. The patient was provided an opportunity to ask questions and all were answered. The patient agreed with the plan and demonstrated an understanding of the instructions.   The patient was advised to call back or seek an in-person evaluation if the symptoms worsen or if the condition fails to improve as anticipated.  I provided 180 minutes of non-face-to-face time during this encounter.   Lise Auer, LCSW

## 2019-01-24 ENCOUNTER — Other Ambulatory Visit: Payer: Self-pay

## 2019-01-24 ENCOUNTER — Encounter (HOSPITAL_COMMUNITY): Payer: Self-pay

## 2019-01-24 ENCOUNTER — Other Ambulatory Visit (HOSPITAL_COMMUNITY): Payer: BC Managed Care – PPO | Admitting: Psychiatry

## 2019-01-24 DIAGNOSIS — F332 Major depressive disorder, recurrent severe without psychotic features: Secondary | ICD-10-CM | POA: Diagnosis not present

## 2019-01-24 DIAGNOSIS — F322 Major depressive disorder, single episode, severe without psychotic features: Secondary | ICD-10-CM

## 2019-01-24 NOTE — Progress Notes (Signed)
Virtual Visit via Video Note  I connected with Kayla Javed on 01/24/19 at  9:00 AM EST by a video enabled telemedicine application and verified that I am speaking with the correct person using two identifiers.  Location: Patient: Kayla Murphy Provider: Lise Auer, LCSW   I discussed the limitations of evaluation and management by telemedicine and the availability of in person appointments. The patient expressed understanding and agreed to proceed.  History of Present Illness: MDD and GAD  Observations/Objective: Case Manager checked in with all participants to review discharge dates, insurance authorizations, work-related documents and needs for the treatment team. Case manager introduced guest speaker, Jeanella Craze, [place], to facilitate a discussion around Grief and Loss topics. Patient participated in discussion and shared insights about their own needs regarding this topic. Counselor allowed time for reflection and journaling on the topic of grief and loss, and promoted continuing this work in individual therapy.   Counselor facilitated a brief check in with group members to gauge mood and current functioning as well as their takeaways from the presentation. Kayla shared that she realized she is grieving the loss of her voice and her identity, having little people she can open up to about things. She is looking forward to exploring this more in treatment. Kayla presented as moderately depression and severely anxious. Counselor then allowed time for a graduating group member share about their overall progress in group and words of encouragement for those remaining in treatment. Group members and Counselor shared their observations and well wishes for the graduating client. All noted that she had a positive impact on their mental health journey as well.   Counselor introduced Field seismologist, Jan Fireman, Yoga Instructor, to guide the group in a yoga practice. Presenter checked in  with all participants to assess the benefits. All group members engaged in the practice and noted positive benefits from the yoga exercises.   Assessment and Plan: Counselor recommends that patient remains in IOP treatment to better manage mental health symptoms and continue to address treatment plan goals. Counselor recommends adherence to crisis/safety plan, taking medications as prescribed and following up with medical professionals if any issues arise.   Follow Up Instructions: Counselor will send Webex link for next session.    I discussed the assessment and treatment plan with the patient. The patient was provided an opportunity to ask questions and all were answered. The patient agreed with the plan and demonstrated an understanding of the instructions.   The patient was advised to call back or seek an in-person evaluation if the symptoms worsen or if the condition fails to improve as anticipated.  I provided 180 minutes of non-face-to-face time during this encounter.   Lise Auer, LCSW

## 2019-01-25 ENCOUNTER — Other Ambulatory Visit (HOSPITAL_COMMUNITY): Payer: BC Managed Care – PPO | Admitting: Psychiatry

## 2019-01-25 ENCOUNTER — Other Ambulatory Visit: Payer: Self-pay

## 2019-01-25 ENCOUNTER — Encounter (HOSPITAL_COMMUNITY): Payer: Self-pay | Admitting: Psychiatry

## 2019-01-25 DIAGNOSIS — F332 Major depressive disorder, recurrent severe without psychotic features: Secondary | ICD-10-CM | POA: Diagnosis not present

## 2019-01-25 DIAGNOSIS — F322 Major depressive disorder, single episode, severe without psychotic features: Secondary | ICD-10-CM

## 2019-01-25 NOTE — Progress Notes (Signed)
Virtual Visit via Video Note  I connected with Kayla Andrepont on 01/25/19 at  9:00 AM EST by a video enabled telemedicine application and verified that I am speaking with the correct person using two identifiers.  Location: Patient: Kayla Murphy Provider: Lise Auer, LCSW   I discussed the limitations of evaluation and management by telemedicine and the availability of in person appointments. The patient expressed understanding and agreed to proceed.  History of Present Illness: MDD   Observations/Objective: Case Manager checked in with all participants to review discharge dates, insurance authorizations, work-related documents and needs for the treatment team. Counselor processed current mood and functioning and discussed how participants spent their time since last session and if skills were applied. Kayla stated that she is trying to challenge herself to talk more in group and with others because she holds too much on her mind. She reported working towards being more intentional with exercising and meal planning. She opened up about insecurities and self-doubt she has in relationships. Group members gave her feedback on ways they have coped with that in the past. Kayla presented with moderate depression and anxiety.    Counselor allowed the group to decide what topic they most wanted discussed or covered. Group members decided that they needed to process how the election and societal factors are impacting their mental health, emotional health, relationships and overall well-being. All group members processed and contributed to the conversation in a respectful and supportive manner.   Counselor checked in with all group members to determine plan for self-care and productivity activities for the afternoon. Kayla plans to walk outside today, shared that she got a massage this week and that she intends on resting more than usual to reenergize.   Assessment and Plan: Counselor recommends that  patient remains in IOP treatment to better manage mental health symptoms and continue to address treatment plan goals. Counselor recommends adherence to crisis/safety plan, taking medications as prescribed and following up with medical professionals if any issues arise.   Follow Up Instructions: Counselor will send Webex link for next session.    I discussed the assessment and treatment plan with the patient. The patient was provided an opportunity to ask questions and all were answered. The patient agreed with the plan and demonstrated an understanding of the instructions.   The patient was advised to call back or seek an in-person evaluation if the symptoms worsen or if the condition fails to improve as anticipated.  I provided 180 minutes of non-face-to-face time during this encounter.   Lise Auer, LCSW

## 2019-01-28 ENCOUNTER — Encounter (HOSPITAL_COMMUNITY): Payer: Self-pay | Admitting: Psychiatry

## 2019-01-28 ENCOUNTER — Other Ambulatory Visit (HOSPITAL_COMMUNITY): Payer: BC Managed Care – PPO | Admitting: Psychiatry

## 2019-01-28 ENCOUNTER — Other Ambulatory Visit: Payer: Self-pay

## 2019-01-28 DIAGNOSIS — F322 Major depressive disorder, single episode, severe without psychotic features: Secondary | ICD-10-CM

## 2019-01-28 DIAGNOSIS — F332 Major depressive disorder, recurrent severe without psychotic features: Secondary | ICD-10-CM | POA: Diagnosis not present

## 2019-01-28 NOTE — Progress Notes (Signed)
Virtual Visit via Video Note  I connected with Kayla Murphy on 01/28/19 at  9:00 AM EST by a video enabled telemedicine application and verified that I am speaking with the correct person using two identifiers.  Location: Patient: Kayla Murphy Provider: Lise Auer, LCSW   I discussed the limitations of evaluation and management by telemedicine and the availability of in person appointments. The patient expressed understanding and agreed to proceed.  History of Present Illness: MDD   Observations/Objective: Case Manager checked in with all participants to review discharge dates, insurance authorizations, work-related documents and needs for the treatment team. Counselor processed current mood and functioning and discussed how participants spent their time since last session and if skills were applied. Kayla shared about a loss that occurred over the weekend and how she was able to utilizing coping skills learned in treatment to prepare and react in a healthier manner than she pas in the past. She reflected on how she is setting better boundaries with people and prioritizing her feelings and needs. Kayla presented with moderate depression and moderate anxiety.    Counselor provided psychoeducation on Assertive Communication sharing a video about assertive communication tips and strategies and sharing 2 handouts on the types communication types, considerations, how to develop assertive body language and communicate with others. Group members had open discussion about their personal communication style and how it impacts their relationships and mental health. Kayla identified as aggressive in her communication style and would like to find more balance in becoming assertive in order to be heard and understood more.   Counselor checked in with all group members to determine plan for self-care and productivity activities for the afternoon. Kayla plans to go on a walk, get things completed around her  home and reflect more on the direction she wants to go after experiencing her loss.   Assessment and Plan: Counselor recommends that patient remains in IOP treatment to better manage mental health symptoms and continue to address treatment plan goals. Counselor recommends adherence to crisis/safety plan, taking medications as prescribed and following up with medical professionals if any issues arise.   Follow Up Instructions: Counselor will send Webex link for next session.    I discussed the assessment and treatment plan with the patient. The patient was provided an opportunity to ask questions and all were answered. The patient agreed with the plan and demonstrated an understanding of the instructions.   The patient was advised to call back or seek an in-person evaluation if the symptoms worsen or if the condition fails to improve as anticipated.  I provided 180 minutes of non-face-to-face time during this encounter.   Lise Auer, LCSW

## 2019-01-29 ENCOUNTER — Other Ambulatory Visit: Payer: Self-pay

## 2019-01-29 ENCOUNTER — Encounter (HOSPITAL_COMMUNITY): Payer: Self-pay

## 2019-01-29 ENCOUNTER — Other Ambulatory Visit (HOSPITAL_COMMUNITY): Payer: BC Managed Care – PPO | Admitting: Psychiatry

## 2019-01-29 DIAGNOSIS — F332 Major depressive disorder, recurrent severe without psychotic features: Secondary | ICD-10-CM | POA: Diagnosis not present

## 2019-01-29 DIAGNOSIS — F322 Major depressive disorder, single episode, severe without psychotic features: Secondary | ICD-10-CM

## 2019-01-29 NOTE — Progress Notes (Signed)
Virtual Visit via Video Note  I connected with Kayla Murphy on 01/29/19 at  9:00 AM EST by a video enabled telemedicine application and verified that I am speaking with the correct person using two identifiers.  Location: Patient: Kayla Murphy Provider: Lise Auer, LCSW   I discussed the limitations of evaluation and management by telemedicine and the availability of in person appointments. The patient expressed understanding and agreed to proceed.  History of Present Illness: MDD   Observations/Objective: Case Manager checked in with all participants to review discharge dates, insurance authorizations, work-related documents and needs for the treatment team. Counselor processed current mood and functioning and discussed how participants spent their time since last session and if skills were applied. Kayla shared about a situation where she had to use her coping skills to avoid becoming anxious or angry. Kayla presented with moderate depression and moderate anxiety.   Counselor provided psychoeducation on Anxiety Vs Depression: The Power of Avoidance sharing a video and several handouts/articles on the cycle of anxiety, cycle of depression and coping strategies. Group members had open discussion about their anxiety and depressive symptoms, how they avoid in unhealthy ways, how they can get better at feeling, and helpful coping strategies. She discussed challenges in her relationship with her mom and how she is addressing them, instead of avoiding them. Kayla was able to identify what how her anxiety and depression and similar and different. She acknowledged that it is going to take consistent work to address everything going on currently and from childhood, but she is feeling more equipped.   Counselor checked in with all group members to determine plan for self-care and productivity activities for the afternoon. Kayla shared that she has not plans, hopes to rest, do laundry and contact HR at her  job.   Assessment and Plan: Counselor recommends that patient remains in IOP treatment to better manage mental health symptoms and continue to address treatment plan goals. Counselor recommends adherence to crisis/safety plan, taking medications as prescribed and following up with medical professionals if any issues arise.   Follow Up Instructions: Counselor will send Webex link for next session.    I discussed the assessment and treatment plan with the patient. The patient was provided an opportunity to ask questions and all were answered. The patient agreed with the plan and demonstrated an understanding of the instructions.   The patient was advised to call back or seek an in-person evaluation if the symptoms worsen or if the condition fails to improve as anticipated.  I provided 180 minutes of non-face-to-face time during this encounter.   Lise Auer, LCSW

## 2019-01-29 NOTE — Psych (Signed)
Virtual Visit via Video Note  I connected with Kayla Murphy on 01/14/19 at  9:00 AM EDT by a video enabled telemedicine application and verified that I am speaking with the correct person using two identifiers.   I discussed the limitations of evaluation and management by telemedicine and the availability of in person appointments. The patient expressed understanding and agreed to proceed.   I discussed the assessment and treatment plan with the patient. The patient was provided an opportunity to ask questions and all were answered. The patient agreed with the plan and demonstrated an understanding of the instructions.   The patient was advised to call back or seek an in-person evaluation if the symptoms worsen or if the condition fails to improve as anticipated.  Pt was provided 240 minutes of non-face-to-face time during this encounter.   [name], LCSW    [facility] PHP THERAPIST PROGRESS NOTE  Kayla Murphy [id number]  Session Time: 9:00 - 10:00  Participation Level: Active  Behavioral Response: CasualAlertDepressed  Type of Therapy: Group Therapy  Treatment Goals addressed: Coping  Interventions: CBT, DBT, Supportive and Reframing  Summary: Clinician led check-in regarding current stressors and situation, and review of patient completed daily inventory. Clinician utilized active listening and empathetic response and validated patient emotions. Clinician facilitated discussion on issues that came up during check-in.  Therapist Response: Kayla Murphy is a 28 y.o. female who presents with depression symptoms. Patient arrived within time allowed and reports that she is feeling "not good." Patient rates her mood at a 8 on a scale of 1-10 with 10 being great. Pt reports she is feeling physically ill and is concerned she may have gotten food poisoning. Pt states her sleep was not sound and she was up and down frequently due to her physical symptoms. 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 Pt reports struggles with keeping boundaries with her ex-partner and that she was engaged frequently with her over the weekend. Pt able to process. Pt engaged in discussion.        Session Time: 10:00-11:00  Participation Level:Active  Behavioral Response:CasualAlertDepressed  Type of Therapy: Group Therapy, psychoeducation, psychotherapy  Treatment Goals addressed: Coping  Interventions:CBT, DBT, Solution Focused, Supportive and Reframing  Summary:Cln led discussion about anxieties surrounding COVID and the effects safety measures are having on our lives. Cln created space for pt's to process struggles and concerns and group members related to and supported one another.    Therapist Response: Pt shares she tries not to think about the pandemic too often and that helps manage her anxieties. Pt reports thinking of it most due to inconveniences such as forgetting her mask. Pt states concerns due to returning to work and maintaining safety if schools remain in person.         Session Time: 11:00- 12:00  Participation Level:Active  Behavioral Response:CasualAlertDepressed  Type of Therapy: Group Therapy, Psychoeducation; Psychotherapy  Treatment Goals addressed: Coping  Interventions:DBT; Solution focused; Supportive; Reframing  Summary:Cln introduced topic of distress tolerance. Cln provided DBT framework to contextualize the skills and discussed process for implementing and practicing the distress tolerance skills.   Therapist Response: Pt engaged in discussion and demonstrates understanding of how distress tolerance skills work and how to practice them.      Session Time: 12:00 -1:00  Participation Level:Active  Behavioral Response:CasualAlertDepressed  Type of Therapy: Group Therapy, Psychoeducation;  Psychotherapy  Treatment Goals addressed: Coping  Interventions:DBT; Solution focused; Supportive; Reframing  Summary:Cln continued topic of distress tolerance skills. Cln introduced the ACCEPTS skill and how therapeutic distraction can be used to manage emotions. Group began to review the skills, discussing A-C-C skills and how to apply them. 12:50 -1:00 Clinician led check-out. Clinician assessed for immediate needs, medication compliance and efficacy, and safety concerns  Therapist Response: Pt engaged in discussion and demonstrates understanding of therapeutic distraction and identifies "activities" examples of using a coloring app, talking on the phone, and watching tv.  12:50 - 1:00: At check-out, patient rates her mood at a 10 on a scale of 1-10 with 10 being great.Pt states afternoon plans of tidying her house.Patient demonstrates someprogress as evidenced by increase in pro-social activities.Patient denies SI/HI/self-harm at the end of group.     Suicidal/Homicidal: Nowithout intent/plan  Plan: Pt will continue in PHP while working to decrease depression symptoms, increase ability to manage symptoms in a healthy manner, and increase level of daily functioning.   Diagnosis: Current severe episode of major depressive disorder without psychotic features without prior episode ([place]) [F32.2]    1. Current severe episode of major depressive disorder without psychotic features without prior episode [facility])       [name], LCSW 01/29/2019

## 2019-01-30 ENCOUNTER — Other Ambulatory Visit: Payer: Self-pay

## 2019-01-30 ENCOUNTER — Other Ambulatory Visit (HOSPITAL_COMMUNITY): Payer: BC Managed Care – PPO

## 2019-01-30 NOTE — Psych (Signed)
Virtual Visit via Video Note  I connected with Kayla Murphy on 01/15/19 at  8:00 AM EDT by a video enabled telemedicine application and verified that I am speaking with the correct person using two identifiers.   I discussed the limitations of evaluation and management by telemedicine and the availability of in person appointments. The patient expressed understanding and agreed to proceed.   I discussed the assessment and treatment plan with the patient. The patient was provided an opportunity to ask questions and all were answered. The patient agreed with the plan and demonstrated an understanding of the instructions.   The patient was advised to call back or seek an in-person evaluation if the symptoms worsen or if the condition fails to improve as anticipated.  Pt was provided 240 minutes of non-face-to-face time during this encounter.   [name], LCSW    [facility] [place] PHP THERAPIST PROGRESS NOTE  Kayla Lovingood [id number]  Session Time: 9:00 - 10:00  Participation Level: Active  Behavioral Response: CasualAlertDepressed  Type of Therapy: Group Therapy  Treatment Goals addressed: Coping  Interventions: CBT, DBT, Supportive and Reframing  Summary: Clinician led check-in regarding current stressors and situation, and review of patient completed daily inventory. Clinician utilized active listening and empathetic response and validated patient emotions. Clinician facilitated discussion on issues that came up during check-in.  Therapist Response: Kayla Voigt is a 28 y.o. female who presents with depression symptoms. Patient arrived within time allowed and reports that she is feeling "all over the place." Patient rates her mood at a 9 on a scale of 1-10 with 10 being great. Pt reports she feels scattered due to trying to organize and plan next steps for herself. 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 Pt reports struggles with focusing. Pt able to process. Pt engaged in discussion.        Session Time: 10:00 -11:00  Participation Level: Active  Behavioral Response: CasualAlertDepressed  Type of Therapy: Group Therapy, psychoeducation, psychotherapy  Treatment Goals addressed: Coping  Interventions: CBT, DBT, Solution Focused, Supportive and Reframing  Summary: Cln led discussion on relationship stress and how to manage it. Group members shared their current relationship struggles and provided each other support.   Therapist Response: Pt reports she has been struggling with "a lot" of relationship stress recently and feels she has begun making progress due to "trying to focus on finding me." Pt is able to process and receive/give feedback and support to other group members.         Session Time: 11:00- 12:00  Participation Level: Active  Behavioral Response: CasualAlertDepressed  Type of Therapy: Group Therapy, Psychoeducation; Psychotherapy  Treatment Goals addressed: Coping  Interventions: CBT; Solution focused; Supportive; Reframing  Summary: Cln continued topic of distress tolerance skills and led review of previous day's discussion. Group continued to work on ACCEPTS skills and   discussed P-T-S skills and how to apply them into their lives.   Therapist Response: Pt engaged in discussion and reports understanding of skills discussed. Pt identified singing a song in her head or reciting a memorization and counting colors as ways she can practice these skills.       Session Time: 12:00 -1:00  Participation Level:Active  Behavioral Response:CasualAlertDepressed  Type of Therapy: Group Therapy, OT  Treatment Goals addressed: Coping  Interventions:Psychosocial skills training, Supportive,   Summary:12:00 -  12:50:Occupational Therapy group 12:50 -1:00 Clinician  led check-out. Clinician assessed for immediate needs, medication compliance and efficacy, and safety concerns   Therapist Response:12:00 - 12:50:Patient engaged in group. See OT note.  12:50 - 1:00: At check-out, patient rates her mood at a 10 on a scale of 1-10 with 10 being great.Pt states afternoon plans of taking a nap and cooking a "celebration dinner" for her treatment progress.Patient demonstrates someprogress as evidenced by increased management of emotions.Patient denies SI/HI/self-harm at the end of group.     Suicidal/Homicidal: Nowithout intent/plan  Plan: Pt will discharge from PHP due to meeting treatment goals of decreased depression symptoms, increased ability to manage symptoms in a healthy manner, and increased level of daily functioning. Pt will step-down to IOP within this agency to increase emotional regulation threshold and stabilization. Pt and provider are aligned with discharge plan. Pt will begin IOP on 01/16/2019. Pt denies SI/HI at time of discharge.   Diagnosis: Current severe episode of major depressive disorder without psychotic features without prior episode (HCC) [F32.2]    1. Current severe episode of major depressive disorder without psychotic features without prior episode [facility])       [name], LCSW 01/30/2019

## 2019-01-31 ENCOUNTER — Other Ambulatory Visit (HOSPITAL_COMMUNITY): Payer: BC Managed Care – PPO | Admitting: Psychiatry

## 2019-01-31 ENCOUNTER — Other Ambulatory Visit: Payer: Self-pay

## 2019-01-31 ENCOUNTER — Encounter (HOSPITAL_COMMUNITY): Payer: Self-pay

## 2019-01-31 DIAGNOSIS — F322 Major depressive disorder, single episode, severe without psychotic features: Secondary | ICD-10-CM

## 2019-01-31 DIAGNOSIS — F332 Major depressive disorder, recurrent severe without psychotic features: Secondary | ICD-10-CM | POA: Diagnosis not present

## 2019-01-31 NOTE — Progress Notes (Signed)
Virtual Visit via Video Note  I connected with Kayla Murphy on 01/31/19 at  9:00 AM EST by a video enabled telemedicine application and verified that I am speaking with the correct person using two identifiers.  Location: Patient: Kayla Murphy Provider: Lise Auer, LCSW   I discussed the limitations of evaluation and management by telemedicine and the availability of in person appointments. The patient expressed understanding and agreed to proceed.  History of Present Illness: MDD  Observations/Objective: Case Manager checked in with all participants to review discharge dates, insurance authorizations, work-related documents and needs for the treatment team. Case manager introduced guest speaker, Jeanella Craze, [place], to facilitate a discussion around Grief and Loss topics. Patient participated in discussion and shared insights about their own needs regarding this topic. Counselor allowed time for reflection and journaling on the topic of grief and loss, and promoted continuing this work in individual therapy.   Counselor facilitated a brief check in with group members to gage mood and current functioning as well as their takeaways from the presentation. Kayla identified that she is grieving the childhood she felt robbed of and discussed how she has worked through her grieving process. Kayla reported that she was able to get adequate rest yesterday that energized her to start working on decorating for Christmas. Kayla explained that she is working to change generational patterns in her life and to create more happiness.   Counselor facilitated a Guided Imagery Script called "Healing Relaxation" with the group, which incorporated progressive muscle relaxation, deep breathing, and visualizations. Group members engaged well and shared their feedback on the experience. Kayla shared that she would like to engage in meditation and yoga more often because she can see the immediate benefits.    Counselor then allowed time for a graduating group member share about their overall progress in group and words of encouragement for those remaining in treatment. Group members and Counselor shared their observations and well wishes for the graduating client. All noted that she had a positive impact on their mental health journey as well.    Assessment and Plan: Counselor recommends that patient remains in IOP treatment to better manage mental health symptoms and continue to address treatment plan goals. Counselor recommends adherence to crisis/safety plan, taking medications as prescribed and following up with medical professionals if any issues arise.   Follow Up Instructions: Counselor will send Webex link for next session.    I discussed the assessment and treatment plan with the patient. The patient was provided an opportunity to ask questions and all were answered. The patient agreed with the plan and demonstrated an understanding of the instructions.   The patient was advised to call back or seek an in-person evaluation if the symptoms worsen or if the condition fails to improve as anticipated.  I provided 180 minutes of non-face-to-face time during this encounter.   Lise Auer, LCSW

## 2019-02-01 ENCOUNTER — Other Ambulatory Visit (HOSPITAL_COMMUNITY): Payer: BC Managed Care – PPO | Admitting: Psychiatry

## 2019-02-01 ENCOUNTER — Encounter (HOSPITAL_COMMUNITY): Payer: Self-pay

## 2019-02-01 ENCOUNTER — Other Ambulatory Visit: Payer: Self-pay

## 2019-02-01 DIAGNOSIS — F332 Major depressive disorder, recurrent severe without psychotic features: Secondary | ICD-10-CM | POA: Diagnosis not present

## 2019-02-01 DIAGNOSIS — F322 Major depressive disorder, single episode, severe without psychotic features: Secondary | ICD-10-CM

## 2019-02-01 NOTE — Progress Notes (Signed)
Virtual Visit via Video Note  I connected with Kayla Murphy on 02/01/19 at  9:00 AM EST by a video enabled telemedicine application and verified that I am speaking with the correct person using two identifiers.  Location: Patient: Kayla Murphy Provider: Lise Auer, LCSW   I discussed the limitations of evaluation and management by telemedicine and the availability of in person appointments. The patient expressed understanding and agreed to proceed.  History of Present Illness: MDD   Observations/Objective: Case Manager checked in with all participants to review discharge dates, insurance authorizations, work-related documents and needs for the treatment team. Counselor processed current mood and functioning and discussed how participants spent their time since last session and if skills were applied. Kayla shared about her best friend and her mother verbalizing to her about the progress they see she is making. Kayla discussed how she is she acknowledges the positive changes as well. She identified areas she would like to continue to grow, specifically in communication and trust of others. Kayla presents with moderate depression and anxiety.  Counselor engaged the group in creating their own Safety/Crisis Plans to follow during mental health episodes and to share with the support systems. Group members shared their responses on each step and discussed how they will share the document with others. Kayla identified her triggers, coping strategies, supports, professionals, what she needs to feel safe and her motivators for living.   Counselor prompted group members to write down their work-related stressors and what they enjoy about their jobs. Group members shared their reflections. Kayla discussed being stifled in her creativity and enjoyment. She discussed being new on the job and the challenges that presents. Counselor provided seven strategies for managing workplace stress through a video  created by a physician out of [place]. Group members shared which strategies they would like to implement in the workplace. Kayla plans to prioritize her day, set boundaries for ending the work day and attempting to get more rest.   Counselor checked in with all group members to determine plan for self-care and productivity activities for the afternoon. Kayla plans to have a Firefighter with her friends and family this weekend to celebrate her success in treatment.   Assessment and Plan: Counselor recommends that patient remains in IOP treatment to better manage mental health symptoms and continue to address treatment plan goals. Counselor recommends adherence to crisis/safety plan, taking medications as prescribed and following up with medical professionals if any issues arise.   Follow Up Instructions: Counselor will send Webex link for next session.    I discussed the assessment and treatment plan with the patient. The patient was provided an opportunity to ask questions and all were answered. The patient agreed with the plan and demonstrated an understanding of the instructions.   The patient was advised to call back or seek an in-person evaluation if the symptoms worsen or if the condition fails to improve as anticipated.  I provided 180 minutes of non-face-to-face time during this encounter.   Lise Auer, LCSW

## 2019-02-04 ENCOUNTER — Other Ambulatory Visit: Payer: Self-pay

## 2019-02-04 ENCOUNTER — Other Ambulatory Visit (HOSPITAL_COMMUNITY): Payer: BC Managed Care – PPO | Admitting: Psychiatry

## 2019-02-04 ENCOUNTER — Encounter (HOSPITAL_COMMUNITY): Payer: Self-pay | Admitting: Psychiatry

## 2019-02-04 DIAGNOSIS — F332 Major depressive disorder, recurrent severe without psychotic features: Secondary | ICD-10-CM | POA: Diagnosis not present

## 2019-02-04 DIAGNOSIS — F322 Major depressive disorder, single episode, severe without psychotic features: Secondary | ICD-10-CM

## 2019-02-04 NOTE — Progress Notes (Signed)
Virtual Visit via Video Note  I connected with Kayla Murphy on 02/04/19 at  9:00 AM EST by a video enabled telemedicine application and verified that I am speaking with the correct person using two identifiers.  Location: Patient: Kayla Murphy Provider: Lise Auer, LCSW   I discussed the limitations of evaluation and management by telemedicine and the availability of in person appointments. The patient expressed understanding and agreed to proceed.  History of Present Illness: MDD   Observations/Objective: Case Manager checked in with all participants to review discharge dates, insurance authorizations, work-related documents and needs for the treatment team. Counselor processed current mood and functioning and discussed how participants spent their time since last session and if skills were applied. Kayla experienced a serious of high stress events over the weekend connected with her mother, sister and best friend. She reflected on what she did well in the situations and how she could improve her responses in the future. She reported being extremely tired from lack of sleep related to the weekend's stressors. Kayla presents with moderate anxiety and depression.   Counselor recapped information discussed at the last session regarding an intro to managing workplace stress. Counselor checked in with the group members who were not present on Friday to get their feedback on their unique work-related stressors and what they enjoy about their jobs. Group members shared their reflections.  Counselor provided a video discussing 10 unconventional ways to eliminate workplace stress and reviewed 3 articles with additional strategies. Group members shared which strategies they would like to implement in the workplace as well as their personal lives. Kayla reported liking the concept of "firing your boss" or "living with the job you chose". She discussed versatility in the jobs she can apply for within her  field of work and would like to be open minded about exploring her options.   Counselor then allowed time for a graduating group member share about their overall progress in group and words of encouragement for those remaining in treatment. Group members and Counselor shared their observations and well wishes for the graduating client. All noted that she had a positive impact on their mental health journey as well.   Assessment and Plan: Counselor recommends that patient remains in IOP treatment to better manage mental health symptoms and continue to address treatment plan goals. Counselor recommends adherence to crisis/safety plan, taking medications as prescribed and following up with medical professionals if any issues arise.   Follow Up Instructions: Counselor will send Webex link for next session.    I discussed the assessment and treatment plan with the patient. The patient was provided an opportunity to ask questions and all were answered. The patient agreed with the plan and demonstrated an understanding of the instructions.   The patient was advised to call back or seek an in-person evaluation if the symptoms worsen or if the condition fails to improve as anticipated.  I provided 180 minutes of non-face-to-face time during this encounter.   Lise Auer, LCSW

## 2019-02-05 ENCOUNTER — Other Ambulatory Visit (HOSPITAL_COMMUNITY): Payer: BC Managed Care – PPO | Admitting: Family

## 2019-02-05 ENCOUNTER — Other Ambulatory Visit: Payer: Self-pay

## 2019-02-05 DIAGNOSIS — F322 Major depressive disorder, single episode, severe without psychotic features: Secondary | ICD-10-CM

## 2019-02-05 DIAGNOSIS — F332 Major depressive disorder, recurrent severe without psychotic features: Secondary | ICD-10-CM | POA: Diagnosis not present

## 2019-02-05 NOTE — Patient Instructions (Signed)
D:  Patient completed MH-IOP today.  A:  Discharge today.  Follow up with Binnie Rail, LCAS on 02-07-19 @ 2pm and Dr. Montel Culver on 02-08-19 @ 10 a.m.  Encouraged support groups.  Return to work on 02-11-19; without any restrictions.  R:  Patient receptive.

## 2019-02-05 NOTE — Progress Notes (Signed)
Virtual Visit via Video Note  I connected with Kayla Murphy on 02/05/19 at 0800 by a video enabled telemedicine application and verified that I am speaking with the correct person using two identifiers. I discussed the limitations of evaluation and management by telemedicine and the availability of in person appointments. The patient expressed understanding and agreed to proceed. I discussed the assessment and treatment plan with the patient. The patient was provided an opportunity to ask questions and all were answered. The patient agreed with the plan and demonstrated an understanding of the instructions.  The patient was advised to call back or seek an in-person evaluation if the symptoms worsen or if the condition fails to improve as anticipated.  I provided 20 minutes of non-face-to-face time during this encounter.   As per previous CCA states: Pt reports to PHP per PCP. Pt denies current providers for mental health. Pt voluntarily went to [place] in 2018 due to depression and "tired of masking the feelings."Pt did not follow-up with providers afterwards. Pt denies any other mental health treatment. 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 Pt reports she lost her job from last year due to "jealousy"reasons. Pt reports others will ask her if she needs help but pt is unsure of what questions to ask so no help is given. Pt reports lack of motivation to get materials ready for class and handle other responsibilities. 2) School: Pt is a Designer, jewellery at [employer]. Pt reports her professor over the summer treated her unfairly and still has not given her the grades for submitted work. Pt reports current class is not going well due to lack of motivation. 3) Relationship struggles: Pt is bisexual and in a "kind of relationship"with a female friend. Pt reports female friend cares about what others think "too  much"and "the church doesn't accept it."Pt shares the female friend has a boyfriend that pt "tolerates"because "what else am I supposed to do?"Pt reports some passive SI; denies HI/AVH. Pt is staying with mother at this time for comfort and support. Pt is written out of work per PCP until 10/14.  Pt transitioned to MH-IOP from [place] today.  Reports continued depressive and anxiety symptoms with passive SI.  Denies a plan or intent.  Able to contract for safety.  Denies A/V hallucinations or HI.  Pt states that PHP was helpful and is looking forward to attending MH-IOP.  "I am nervous about being in another group with new people, but I am looking forward to seeing what it brings."  Pt is still learning ways to cope with the above stressors.  Pt attended 13 days out of the 15 that were authorized.  Pt states she is doing better. C/O not mastering her emotions as of yet.  Reports that the groups were helpful with identifying underlying issues and getting the support that she needed.  Pt is requesting to take her Wellbutrin only once per day.  Denies SI/HI or A/V hallucinations.   A: D/C pt today.  F/U with Binnie Rail, LCAS on 02-07-19 @ 2pm and Dr. Montel Culver on 02-08-19 @ 10 a.m.  Encouraged support groups thru [place] [facility] [place].  RTW on 02-11-19; without any restrictions.  R:  Patient receptive.   Dellia Nims, M.Ed,CNA

## 2019-02-06 ENCOUNTER — Encounter (HOSPITAL_COMMUNITY): Payer: Self-pay | Admitting: Family

## 2019-02-06 NOTE — Progress Notes (Signed)
Virtual Visit via Video Note  I connected with Kayla Murphy on 02/06/19 at  9:00 AM EST by a video enabled telemedicine application and verified that I am speaking with the correct person using two identifiers.  Location: Patient: Kayla Murphy Provider: Lise Auer, LCSW   I discussed the limitations of evaluation and management by telemedicine and the availability of in person appointments. The patient expressed understanding and agreed to proceed.  History of Present Illness: MDD   Observations/Objective: Case Manager checked in with all participants to review discharge dates, insurance authorizations, work-related documents and needs for the treatment team. Counselor processed current mood and functioning and discussed how participants spent their time since last session and if skills were applied. Kayla shared about issues with her sleep patterns and hygiene. She spent time putting up her [place] and the remaining decorations, which was a positive outlet, bringing her joy.   Counselor introduced Agricultural consultant, Shade Flood, LCSW, [place] who presented on Coping Strategies for depression and practiced a mindfulness exercise for application. Group members participated in the discussion, gave feedback on their depression and strategies they would be willing to try or implement. Kayla identified that she would like to continue a focus on self-care, including cooking and eating healthy meals, getting on a workout routine, and sleep routine.  Counselor acknowledged two graduating group members, Kayla being one, highlighting their progress and growth during group. Each shared about their treatment takeaways, post-treatment plans and offered gratitude statements to remaining group members and counselor. Kayla shared that she has learned to put herself first and in doing so she is starting to get to know and appreciate "the real Kayla." She is more aware of her triggers and how her communication  style impacts her relationships. She wants to continue growing in being honest with herself and others through verbalizing her feelings. Group members offered well wishes and encouragement as they transition out of group.   Assessment and Plan: Counselor recommends that patient steps down from IOP treatment individual therapy and medication management to contiue management of mental health symptoms and to address treatment plan goals. Counselor recommends adherence to crisis/safety plan, taking medications as prescribed and following up with medical professionals if any issues arise.   Follow Up Instructions: Counselor will send Webex link for next session.    I discussed the assessment and treatment plan with the patient. The patient was provided an opportunity to ask questions and all were answered. The patient agreed with the plan and demonstrated an understanding of the instructions.   The patient was advised to call back or seek an in-person evaluation if the symptoms worsen or if the condition fails to improve as anticipated.  I provided 180 minutes of non-face-to-face time during this encounter.   Lise Auer, LCSW

## 2019-02-06 NOTE — Progress Notes (Signed)
   [facility] Discharge Summary  Kayla Murphy [id number]  Admission date: 01/16/2019 Discharge date: 02/05/2019  Reason for admission: Per admission assessment note:Per assessment note: Kayla Murphy a 28 y.o. African Americanfemale presents with depression, poor concentration and worsening anxiety. Reported "life has become too overwhelming."Reports she has noted she has become more tearful andisolative.Reports a previous inpatient admission for suicidal attempt. States she she is starting to feel the same as she did when she admitted herself into [facility]. States she did not keep follow-up appointment with psychiatry or therapist at that time. States she was prescribed a medication Wellbutrin however did not continue. States her primary care provider has recently restarted her on Wellbutrin she reports she does not feel as if the medication is helping with her symptoms. Has intermittent passive suicidal ideations. Currently denying plan or intent.  Chemical Use History: Denied   Family of Origin Issues: Ongoing, during Kayla Murphy's outpatient programming she expressed  struggle related to family dynamic.  as she reports during partial hospitalization programming failed communication between she and her mother.  Reports she is trying to establish a new normal as her family has a poor history of communication.  Reports she noticed that her grandmother is not effective with communicating with her mother and she is hopeful to change the family dynamics.  Progress in Program Toward Treatment Goals: Ongoing, patient attended and participated with daily group session with active and engaged participation.  Patient recently completed partial hospitalization programming and concluded with intensive outpatient programming.  Continues to report anxiety related to returning back to work.  Patient to continue working on coping skills.  NP attempted to  follow-up with patient for discharge assessment no answer.-Staff reported patient has concerns regarding her Wellbutrin however no answer patient to follow-up with attending psychiatrist.   Progress (rationale): Keep follow-up with Charolotte Eke at 02/07/2019 1500 and Dr. Montel Culver 02/08/2019 @ 10:00 am   Take all medications as prescribed. Keep all follow-up appointments as scheduled.  Do not consume alcohol or use illegal drugs while on prescription medications. Report any adverse effects from your medications to your primary care provider promptly.  In the event of recurrent symptoms or worsening symptoms, call 911, a crisis hotline, or go to the nearest emergency department for evaluation.   Derrill Center, NP 02/06/2019

## 2019-02-07 ENCOUNTER — Other Ambulatory Visit: Payer: Self-pay

## 2019-02-07 ENCOUNTER — Ambulatory Visit (INDEPENDENT_AMBULATORY_CARE_PROVIDER_SITE_OTHER): Payer: BC Managed Care – PPO | Admitting: Licensed Clinical Social Worker

## 2019-02-07 ENCOUNTER — Ambulatory Visit: Payer: Self-pay | Admitting: Physician Assistant

## 2019-02-07 ENCOUNTER — Encounter (HOSPITAL_COMMUNITY): Payer: Self-pay | Admitting: Licensed Clinical Social Worker

## 2019-02-07 DIAGNOSIS — F322 Major depressive disorder, single episode, severe without psychotic features: Secondary | ICD-10-CM | POA: Diagnosis not present

## 2019-02-07 NOTE — Progress Notes (Signed)
Virtual Visit via Video Note  I connected with Kayla Murphy on 02/07/19 at  2:00 PM EST by a video enabled telemedicine application and verified that I am speaking with the correct person using two identifiers.   I discussed the limitations of evaluation and management by telemedicine and the availability of in person appointments. The patient expressed understanding and agreed to proceed.  History of Present Illness: Patient is referred for individual therapy after the completion of PHP and IOP for depression.    Observations/Objective: Patient presents for her initial individual counseling session via Webex. Patient just completed PHP and IOP. Spent a considerable amount of time building a trusting, therapeutic relationship and gaining background information. 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 She has been on FMLA. Pt reports she doesn't feel supported at work and is concerned about going back to work Monday. Pt reports she is confrontational and struggles with relationships. She has a limited relationship with her biological mother. She was raised by several family members. She has an older sister who she has a limited relationship with her older sister, "who is very jealous of me." Discussed her support systems, which are minimal. She has a rocky relationship with a woman, which is a stressor for patient. Reviewed stressors and coping skills learned in PHP and IOP.  Assessment and Plan: Counselor will continue to meet with patient to address treatment plan goals. Patient will continue to follow recommendations of providers and implement skills learned in session.   Follow Up Instructions:    I discussed the assessment and treatment plan with the patient. The patient was provided an opportunity to ask questions and all were answered. The patient agreed with the plan and demonstrated an understanding of the instructions.   The patient was advised to call back or seek an  in-person evaluation if the symptoms worsen or if the condition fails to improve as anticipated.  I provided 60 minutes of non-face-to-face time during this encounter.   Kayla Murphy, LCAS

## 2019-02-08 ENCOUNTER — Ambulatory Visit (HOSPITAL_COMMUNITY): Payer: BC Managed Care – PPO | Admitting: Psychiatry

## 2019-02-08 ENCOUNTER — Other Ambulatory Visit: Payer: Self-pay

## 2019-02-19 ENCOUNTER — Encounter (HOSPITAL_COMMUNITY): Payer: Self-pay | Admitting: Licensed Clinical Social Worker

## 2019-02-19 ENCOUNTER — Other Ambulatory Visit: Payer: Self-pay

## 2019-02-19 ENCOUNTER — Ambulatory Visit (INDEPENDENT_AMBULATORY_CARE_PROVIDER_SITE_OTHER): Payer: BC Managed Care – PPO | Admitting: Licensed Clinical Social Worker

## 2019-02-19 DIAGNOSIS — F322 Major depressive disorder, single episode, severe without psychotic features: Secondary | ICD-10-CM

## 2019-02-19 NOTE — Progress Notes (Addendum)
Virtual Visit via Video Note  I connected with Kayla Murphy on 02/19/19 at  3:00 PM EST by a video enabled telemedicine application and verified that I am speaking with the correct person using two identifiers.   I discussed the limitations of evaluation and management by telemedicine and the availability of in person appointments. The patient expressed understanding and agreed to proceed.  History of Present Illness: Patient is referred for individual therapy after completion of PHP and IOP for depression and anxiety.    Observations/Objective: Patient presents for her initial individual therapy session. Patient discussed her psychiatric symptoms and current life events. Spent a considerable amount of the session building a trusting, therapeutic relationship. Patient is back at work and continues with the same stressors: lack of support. Patient reports she continues with a conflictual family relationship (mother and sister) - confrontation with sister and mother 3 days ago. Previous relationship with mother is "spotty."Partner relationship problems. "I have a lot of drama going on in my life. I have a lot of things I need to address in my life but I don't want to bring it up or talk about it." Discussed patient's depression and anxiety symptoms. Discussed what coping skills patient learned in PHP and IOP that works.  PLAN: Review treatment plan. Missed appointment with Dr. Mamie Nick.   Assessment and Plan:  Counselor will continue to meet with patient to address treatment plan goals. Patient will continue to follow recommendations of providers and implement skills learned in session.   Follow Up Instructions:  Counselor will send webex request prior to session. II discussed the assessment and treatment plan with the patient. The patient was provided an opportunity to ask questions and all were answered. The patient agreed with the plan and demonstrated an understanding of the instructions.   The patient  was advised to call back or seek an in-person evaluation if the symptoms worsen or if the condition fails to improve as anticipated.  I provided 60 minutes of non-face-to-face time during this encounter.   Antawan Mchugh S, LCAS

## 2019-02-26 ENCOUNTER — Other Ambulatory Visit: Payer: Self-pay

## 2019-02-26 ENCOUNTER — Ambulatory Visit (INDEPENDENT_AMBULATORY_CARE_PROVIDER_SITE_OTHER): Payer: BC Managed Care – PPO | Admitting: Licensed Clinical Social Worker

## 2019-02-26 ENCOUNTER — Encounter (HOSPITAL_COMMUNITY): Payer: Self-pay | Admitting: Licensed Clinical Social Worker

## 2019-02-26 DIAGNOSIS — F332 Major depressive disorder, recurrent severe without psychotic features: Secondary | ICD-10-CM

## 2019-02-26 NOTE — Progress Notes (Signed)
Virtual Visit via Video Note  I connected with Niger Riggin on 02/26/19 at 3:15pm EST by a video enabled telemedicine application and verified that I am speaking with the correct person using two identifiers.   I discussed the limitations of evaluation and management by telemedicine and the availability of in person appointments. The patient expressed understanding and agreed to proceed.  History of Present Illness: Patient is referred for individual therapy after completion of PHP and IOP for depression and anxiety.    Observations/Objective: Patient presents for her individual therapy session. Patient discussed her psychiatric symptoms and current life events. Patient continues with the same stressors: lack of support at her job and the conflictual family relationship (mother and sister) and her personal relationship. Coached patient on her positive relationships vs her conflictual relationships. Patient continues to talk about "her issues" but doesn't want to process them, use her skills to move forward or work with suggestions.     PLAN: Review treatment plan   Assessment and Plan:  Counselor will continue to meet with patient to address treatment plan goals. Patient will continue to follow recommendations of providers and implement skills learned in session.   Follow Up Instructions:  Counselor will send webex request prior to session. II discussed the assessment and treatment plan with the patient. The patient was provided an opportunity to ask questions and all were answered. The patient agreed with the plan and demonstrated an understanding of the instructions.   The patient was advised to call back or seek an in-person evaluation if the symptoms worsen or if the condition fails to improve as anticipated.  I provided 45 minutes of non-face-to-face time during this encounter.   MACKENZIE,LISBETH S, LCAS

## 2019-02-28 ENCOUNTER — Encounter: Payer: Self-pay | Admitting: Family Medicine

## 2019-03-04 ENCOUNTER — Encounter: Payer: Self-pay | Admitting: Gastroenterology

## 2019-03-12 ENCOUNTER — Ambulatory Visit (HOSPITAL_COMMUNITY): Payer: BC Managed Care – PPO | Admitting: Licensed Clinical Social Worker

## 2019-03-12 ENCOUNTER — Other Ambulatory Visit: Payer: Self-pay

## 2019-03-19 ENCOUNTER — Ambulatory Visit (INDEPENDENT_AMBULATORY_CARE_PROVIDER_SITE_OTHER): Payer: BC Managed Care – PPO | Admitting: Licensed Clinical Social Worker

## 2019-03-19 ENCOUNTER — Encounter (HOSPITAL_COMMUNITY): Payer: Self-pay | Admitting: Licensed Clinical Social Worker

## 2019-03-19 ENCOUNTER — Other Ambulatory Visit: Payer: Self-pay

## 2019-03-19 DIAGNOSIS — F332 Major depressive disorder, recurrent severe without psychotic features: Secondary | ICD-10-CM

## 2019-03-19 NOTE — Progress Notes (Signed)
Virtual Visit via Video Note  I connected with Niger Dimichele on 03/19/19 at 10:00 am EST by a video enabled telemedicine application and verified that I am speaking with the correct person using two identifiers.   I discussed the limitations of evaluation and management by telemedicine and the availability of in person appointments. The patient expressed understanding and agreed to proceed.  History of Present Illness: Patient is referred for individual therapy after completion of PHP and IOP for depression and anxiety.    Observations/Objective: Patient presents for her individual therapy session. Patient discussed her psychiatric symptoms and current life events. Patient continues with the same stressors: lack of support at her job and the conflictual family relationship (mother and sister) and her personal relationship. Patient shared how her relationships are affecting her depressive symptoms. Discussed, "what are you willing to do different to get your needs met and to have a happier work/life balance?." Reviewed tx plan with patient where she verbalized acceptance of the plan.     Assessment and Plan:  Counselor will continue to meet with patient to address treatment plan goals. Patient will continue to follow recommendations of providers and implement skills learned in session.   Follow Up Instructions:  Counselor will send webex request prior to session. II discussed the assessment and treatment plan with the patient. The patient was provided an opportunity to ask questions and all were answered. The patient agreed with the plan and demonstrated an understanding of the instructions.   The patient was advised to call back or seek an in-person evaluation if the symptoms worsen or if the condition fails to improve as anticipated.  I provided 60 minutes of non-face-to-face time during this encounter.   Aadit Hagood S, LCAS

## 2019-03-26 ENCOUNTER — Other Ambulatory Visit: Payer: Self-pay

## 2019-03-26 ENCOUNTER — Encounter (HOSPITAL_COMMUNITY): Payer: Self-pay | Admitting: Licensed Clinical Social Worker

## 2019-03-26 ENCOUNTER — Ambulatory Visit (INDEPENDENT_AMBULATORY_CARE_PROVIDER_SITE_OTHER): Payer: BC Managed Care – PPO | Admitting: Licensed Clinical Social Worker

## 2019-03-26 DIAGNOSIS — F332 Major depressive disorder, recurrent severe without psychotic features: Secondary | ICD-10-CM | POA: Diagnosis not present

## 2019-03-26 NOTE — Progress Notes (Signed)
Virtual Visit via Video Note  I connected with Kayla Murphy on 03/26/19 at 4:10pm EST by a video enabled telemedicine application and verified that I am speaking with the correct person using two identifiers.   I discussed the limitations of evaluation and management by telemedicine and the availability of in person appointments. The patient expressed understanding and agreed to proceed.  History of Present Illness: Patient is referred for individual therapy after completion of PHP and IOP for depression and anxiety.    Observations/Objective: Patient presents for her individual therapy session. Patient discussed her psychiatric symptoms and current life events. Patient was at school when she joined the session from her car. Today was the first day with students back in the classroom. 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 Used Socratic questions.   Assessment and Plan:  Counselor will continue to meet with patient to address treatment plan goals. Patient will continue to follow recommendations of providers and implement skills learned in session.   Follow Up Instructions:  Counselor will send webex request prior to session. II discussed the assessment and treatment plan with the patient. The patient was provided an opportunity to ask questions and all were answered. The patient agreed with the plan and demonstrated an understanding of the instructions.   The patient was advised to call back or seek an in-person evaluation if the symptoms worsen or if the condition fails to improve as anticipated.  I provided 45 minutes of non-face-to-face time during this encounter.   Laura Caldas S, LCAS

## 2019-04-02 ENCOUNTER — Ambulatory Visit (HOSPITAL_COMMUNITY): Payer: BC Managed Care – PPO | Admitting: Licensed Clinical Social Worker

## 2019-04-02 ENCOUNTER — Other Ambulatory Visit: Payer: Self-pay

## 2019-04-02 ENCOUNTER — Encounter (HOSPITAL_COMMUNITY): Payer: Self-pay | Admitting: Licensed Clinical Social Worker

## 2019-04-02 DIAGNOSIS — F332 Major depressive disorder, recurrent severe without psychotic features: Secondary | ICD-10-CM

## 2019-04-02 NOTE — Progress Notes (Signed)
Virtual Visit via Video Note  I connected with Kayla Murphy on 04/02/19 at 3:30pm EST by a video enabled telemedicine application and verified that I am speaking with the correct person using two identifiers.   I discussed the limitations of evaluation and management by telemedicine and the availability of in person appointments. The patient expressed understanding and agreed to proceed.  History of Present Illness: Patient is referred for individual therapy after completion of PHP and IOP for depression and anxiety.    Observations/Objective: Patient presents for her individual therapy session. Patient discussed her psychiatric symptoms and current life events. Patient was at school when she joined the session 30 minutes late, still in the classroom.. This is the 3rd time patient has been late for her session. Discussed with patient her dedication for continued therapy, what the expectations are and what the next steps are. Patient was not happy and became tearful. Discussed strategies and reminders for her upcoming sessions.  Assessment and Plan:  Counselor will continue to meet with patient to address treatment plan goals. Patient will continue to follow recommendations of providers and implement skills learned in session.   Follow Up Instructions:  Counselor will send webex request prior to session. II discussed the assessment and treatment plan with the patient. The patient was provided an opportunity to ask questions and all were answered. The patient agreed with the plan and demonstrated an understanding of the instructions.   The patient was advised to call back or seek an in-person evaluation if the symptoms worsen or if the condition fails to improve as anticipated.  I provided 25 minutes of non-face-to-face time during this encounter.   Kayla Murphy S, LCAS

## 2019-04-05 ENCOUNTER — Ambulatory Visit: Payer: Self-pay | Admitting: Gastroenterology

## 2019-04-09 ENCOUNTER — Ambulatory Visit (INDEPENDENT_AMBULATORY_CARE_PROVIDER_SITE_OTHER): Payer: BC Managed Care – PPO | Admitting: Licensed Clinical Social Worker

## 2019-04-09 ENCOUNTER — Other Ambulatory Visit: Payer: Self-pay

## 2019-04-09 ENCOUNTER — Encounter (HOSPITAL_COMMUNITY): Payer: Self-pay | Admitting: Licensed Clinical Social Worker

## 2019-04-09 DIAGNOSIS — F332 Major depressive disorder, recurrent severe without psychotic features: Secondary | ICD-10-CM | POA: Diagnosis not present

## 2019-04-09 NOTE — Progress Notes (Signed)
Virtual Visit via Video Note  I connected with [place] Swantek on 04/09/19 at 3:00pm EST by a video enabled telemedicine application and verified that I am speaking with the correct person using two identifiers.   I discussed the limitations of evaluation and management by telemedicine and the availability of in person appointments. The patient expressed understanding and agreed to proceed.  History of Present Illness: Patient is referred for individual therapy after completion of PHP and IOP for depression.    Observations/Objective: Patient presents agitated and anxious for her individual therapy session. Patient discussed her psychiatric symptoms and current life events. Patient was at school and joined her therapy session on time. Patient discussed her difficulty in relationships. Coached patient on problem solving, effective communication, using I -  Statements, control issues, vulnerability.   HOMEWORK: Bring her handouts from IOP and PHP to review at next session. Pt needs a psychiatrist appointment.  Assessment and Plan:  Counselor will continue to meet with patient to address treatment plan goals. Patient will continue to follow recommendations of providers and implement skills learned in session.   Follow Up Instructions:  Counselor will send webex request prior to session. II discussed the assessment and treatment plan with the patient. The patient was provided an opportunity to ask questions and all were answered. The patient agreed with the plan and demonstrated an understanding of the instructions.   The patient was advised to call back or seek an in-person evaluation if the symptoms worsen or if the condition fails to improve as anticipated.  I provided 45 minutes of non-face-to-face time during this encounter.   Shell Yandow S, LCAS

## 2019-04-17 ENCOUNTER — Other Ambulatory Visit: Payer: Self-pay

## 2019-04-17 ENCOUNTER — Encounter (HOSPITAL_COMMUNITY): Payer: Self-pay | Admitting: Licensed Clinical Social Worker

## 2019-04-17 ENCOUNTER — Ambulatory Visit (INDEPENDENT_AMBULATORY_CARE_PROVIDER_SITE_OTHER): Payer: BC Managed Care – PPO | Admitting: Licensed Clinical Social Worker

## 2019-04-17 DIAGNOSIS — F332 Major depressive disorder, recurrent severe without psychotic features: Secondary | ICD-10-CM

## 2019-04-17 NOTE — Progress Notes (Signed)
Virtual Visit via Video Note  I connected with Kayla Murphy on 04/17/19 at 3:00pm EST by a video enabled telemedicine application and verified that I am speaking with the correct person using two identifiers.   I discussed the limitations of evaluation and management by telemedicine and the availability of in person appointments. The patient expressed understanding and agreed to proceed.  History of Present Illness: Patient is referred for individual therapy after completion of PHP and IOP for depression.    Observations/Objective: Patient presents agitated and anxious for her individual therapy session. Patient discussed her psychiatric symptoms and current life events. Patient was in her car leaving school and joined her therapy session on time. 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 Patient described the incident. Used socratic questions.Role played communication skills and problem solving within school parameters.    HOMEWORK: Bring her handouts from IOP and PHP to review at next session. Pt needs a psychiatrist appointment.  Assessment and Plan:  Counselor will continue to meet with patient to address treatment plan goals. Patient will continue to follow recommendations of providers and implement skills learned in session.   Follow Up Instructions:  Counselor will send webex request prior to session. II discussed the assessment and treatment plan with the patient. The patient was provided an opportunity to ask questions and all were answered. The patient agreed with the plan and demonstrated an understanding of the instructions.   The patient was advised to call back or seek an in-person evaluation if the symptoms worsen or if the condition fails to improve as anticipated.  I provided 60 minutes of non-face-to-face time during this encounter.   Kayla Murphy S, LCAS

## 2019-04-22 ENCOUNTER — Encounter (HOSPITAL_COMMUNITY): Payer: Self-pay | Admitting: Licensed Clinical Social Worker

## 2019-04-22 ENCOUNTER — Other Ambulatory Visit: Payer: Self-pay

## 2019-04-22 ENCOUNTER — Ambulatory Visit (INDEPENDENT_AMBULATORY_CARE_PROVIDER_SITE_OTHER): Payer: BC Managed Care – PPO | Admitting: Licensed Clinical Social Worker

## 2019-04-22 DIAGNOSIS — F332 Major depressive disorder, recurrent severe without psychotic features: Secondary | ICD-10-CM | POA: Diagnosis not present

## 2019-04-22 NOTE — Progress Notes (Signed)
Virtual Visit via Video Note  I connected with Kayla Murphy on 04/22/19 at 3:00pm EST by a video enabled telemedicine application and verified that I am speaking with the correct person using two identifiers.   I discussed the limitations of evaluation and management by telemedicine and the availability of in person appointments. The patient expressed understanding and agreed to proceed.  History of Present Illness: Patient is referred for individual therapy after completion of PHP and IOP for depression.    Observations/Objective: Patient presents agitated and anxious for her individual therapy session. Patient discussed her psychiatric symptoms and current life events. Patient was in her car leaving school and joined her therapy session 5 minutes late.     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 Patient described the incident. Used socratic questions.Role played communication skills and problem solving within school parameters.  PLAN: motivation, journaling, Ainsley Spinner building confidence, focus   HOMEWORK: Bring her handouts from IOP and PHP to review at next session. Pt needs a psychiatrist appointment.  Assessment and Plan:  Counselor will continue to meet with patient to address treatment plan goals. Patient will continue to follow recommendations of providers and implement skills learned in session.   Follow Up Instructions:  Counselor will send webex request prior to session. II discussed the assessment and treatment plan with the patient. The patient was provided an opportunity to ask questions and all were answered. The patient agreed with the plan and demonstrated an understanding of the instructions.   The patient was advised to call back or seek an in-person evaluation if the symptoms worsen or if the condition fails to improve as anticipated.  I provided 60 minutes of non-face-to-face time during this encounter.   Nura Cahoon S,  LCAS

## 2019-04-29 ENCOUNTER — Other Ambulatory Visit: Payer: Self-pay

## 2019-04-29 ENCOUNTER — Encounter (HOSPITAL_COMMUNITY): Payer: Self-pay | Admitting: Licensed Clinical Social Worker

## 2019-04-29 ENCOUNTER — Ambulatory Visit (INDEPENDENT_AMBULATORY_CARE_PROVIDER_SITE_OTHER): Payer: BC Managed Care – PPO | Admitting: Licensed Clinical Social Worker

## 2019-04-29 DIAGNOSIS — F332 Major depressive disorder, recurrent severe without psychotic features: Secondary | ICD-10-CM

## 2019-04-29 NOTE — Progress Notes (Signed)
Virtual Visit via Video Note  I connected with Kayla Murphy on 04/29/19 at 3:00pm EST by a video enabled telemedicine application and verified that I am speaking with the correct person using two identifiers.   I discussed the limitations of evaluation and management by telemedicine and the availability of in person appointments. The patient expressed understanding and agreed to proceed.  History of Present Illness: Patient is referred for individual therapy after completion of PHP and IOP for depression.    Observations/Objective: Patient presents agitated and anxious for her individual therapy session. Patient discussed her psychiatric symptoms and current life events. Patient presented therapy session 5 minutes late. Patient described her current frustration and disappointment with her mother. Used socratic questions. Coached patient on using boundaries even within family. Coached patient on what she has control over - herself. Coached patient on gratitude.       PLAN: motivation, journaling, Ainsley Spinner building confidence, focus. To send patient gratitude journal.    Assessment and Plan:  Counselor will continue to meet with patient to address treatment plan goals. Patient will continue to follow recommendations of providers and implement skills learned in session.   Follow Up Instructions:  Counselor will send webex request prior to session. II discussed the assessment and treatment plan with the patient. The patient was provided an opportunity to ask questions and all were answered. The patient agreed with the plan and demonstrated an understanding of the instructions.   The patient was advised to call back or seek an in-person evaluation if the symptoms worsen or if the condition fails to improve as anticipated.  I provided 60 minutes of non-face-to-face time during this encounter.   Karsin Pesta S, LCAS

## 2019-05-06 ENCOUNTER — Encounter (HOSPITAL_COMMUNITY): Payer: Self-pay | Admitting: Licensed Clinical Social Worker

## 2019-05-06 ENCOUNTER — Other Ambulatory Visit: Payer: Self-pay

## 2019-05-06 ENCOUNTER — Ambulatory Visit (INDEPENDENT_AMBULATORY_CARE_PROVIDER_SITE_OTHER): Payer: BC Managed Care – PPO | Admitting: Licensed Clinical Social Worker

## 2019-05-06 DIAGNOSIS — F332 Major depressive disorder, recurrent severe without psychotic features: Secondary | ICD-10-CM

## 2019-05-06 NOTE — Progress Notes (Addendum)
Virtual Visit via Video Note  I connected with Kayla Murphy on 05/06/19 at 3:15pm EST by a video enabled telemedicine application and verified that I am speaking with the correct person using two identifiers.   I discussed the limitations of evaluation and management by telemedicine and the availability of in person appointments. The patient expressed understanding and agreed to proceed.  History of Present Illness: Patient is referred for individual therapy after completion of PHP and IOP for depression.    Observations/Objective: Patient presents agitated and anxious for her individual therapy session. Patient discussed her psychiatric symptoms and current life events. Patient presented therapy session 15 minutes late, with excuses.Discussed the importance of promptness in joining the therapy session. Patient described her current frustration and disappointment with her family.. Patient has some family drama with her sister, where the court system has gotten involved. Assessed family dynamics and discussed unresolved/generational/historic family issues.  PLAN: Happiness Ted Talk     PLAN: motivation, journaling, Ainsley Spinner building confidence, focus. To send patient gratitude journal.    Assessment and Plan:  Counselor will continue to meet with patient to address treatment plan goals. Patient will continue to follow recommendations of providers and implement skills learned in session.   Follow Up Instructions:  Counselor will send webex request prior to session. II discussed the assessment and treatment plan with the patient. The patient was provided an opportunity to ask questions and all were answered. The patient agreed with the plan and demonstrated an understanding of the instructions.   The patient was advised to call back or seek an in-person evaluation if the symptoms worsen or if the condition fails to improve as anticipated.  I provided 45 minutes of non-face-to-face time  during this encounter.   Arav Bannister S, LCAS

## 2019-05-13 ENCOUNTER — Other Ambulatory Visit: Payer: Self-pay

## 2019-05-13 ENCOUNTER — Ambulatory Visit (INDEPENDENT_AMBULATORY_CARE_PROVIDER_SITE_OTHER): Payer: BC Managed Care – PPO | Admitting: Licensed Clinical Social Worker

## 2019-05-13 ENCOUNTER — Encounter (HOSPITAL_COMMUNITY): Payer: Self-pay | Admitting: Licensed Clinical Social Worker

## 2019-05-13 DIAGNOSIS — F332 Major depressive disorder, recurrent severe without psychotic features: Secondary | ICD-10-CM

## 2019-05-13 NOTE — Progress Notes (Addendum)
Virtual Visit via Video Note  I connected with Kayla Murphy on 05/13/19 at 4:00pm EST by a video enabled telemedicine application and verified that I am speaking with the correct person using two identifiers.   I discussed the limitations of evaluation and management by telemedicine and the availability of in person appointments. The patient expressed understanding and agreed to proceed.  History of Present Illness: Patient is referred for individual therapy after completion of PHP and IOP for depression.    Observations/Objective: Patient presents agitated and anxious for her individual therapy session. Patient discussed her psychiatric symptoms and current life events..Patient discussed her current stressors and life events. She continues to have relationship issues. Used CBT to assist patient with relationships, also coached patient on effective communication skills.Pateint watched Shawn Achor "Happiness." Discussed with patient the lens patient looks at the world through, and changing her lens she can can change the way she views the world, which can change her happiness.    PLAN: motivation, journaling, Ainsley Spinner building confidence, focus. To send patient gratitude journal.  PLAN: Discuss marijuana and alcohol abuse  Assessment and Plan:  Counselor will continue to meet with patient to address treatment plan goals. Patient will continue to follow recommendations of providers and implement skills learned in session.   Follow Up Instructions:  Counselor will send webex request prior to session. II discussed the assessment and treatment plan with the patient. The patient was provided an opportunity to ask questions and all were answered. The patient agreed with the plan and demonstrated an understanding of the instructions.   The patient was advised to call back or seek an in-person evaluation if the symptoms worsen or if the condition fails to improve as anticipated.  I provided 45  minutes of non-face-to-face time during this encounter.   Jessilynn Taft S, LCAS

## 2019-05-14 ENCOUNTER — Other Ambulatory Visit: Payer: Self-pay

## 2019-05-14 ENCOUNTER — Ambulatory Visit (INDEPENDENT_AMBULATORY_CARE_PROVIDER_SITE_OTHER): Payer: BC Managed Care – PPO | Admitting: Psychiatry

## 2019-05-14 ENCOUNTER — Encounter (HOSPITAL_COMMUNITY): Payer: Self-pay | Admitting: Psychiatry

## 2019-05-14 DIAGNOSIS — F419 Anxiety disorder, unspecified: Secondary | ICD-10-CM

## 2019-05-14 DIAGNOSIS — F1010 Alcohol abuse, uncomplicated: Secondary | ICD-10-CM | POA: Diagnosis not present

## 2019-05-14 DIAGNOSIS — F39 Unspecified mood [affective] disorder: Secondary | ICD-10-CM

## 2019-05-14 DIAGNOSIS — F1210 Cannabis abuse, uncomplicated: Secondary | ICD-10-CM

## 2019-05-14 MED ORDER — BUPROPION HCL ER (XL) 150 MG PO TB24: 150.0000 mg | ORAL_TABLET | Freq: Every morning | ORAL | 1 refills | Status: DC

## 2019-05-14 MED ORDER — LAMOTRIGINE 25 MG PO TABS: ORAL_TABLET | ORAL | 1 refills | Status: DC
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
## 2019-05-20 ENCOUNTER — Ambulatory Visit (INDEPENDENT_AMBULATORY_CARE_PROVIDER_SITE_OTHER): Payer: BC Managed Care – PPO | Admitting: Licensed Clinical Social Worker

## 2019-05-20 ENCOUNTER — Encounter (HOSPITAL_COMMUNITY): Payer: Self-pay | Admitting: Licensed Clinical Social Worker

## 2019-05-20 ENCOUNTER — Other Ambulatory Visit: Payer: Self-pay

## 2019-05-20 DIAGNOSIS — F1010 Alcohol abuse, uncomplicated: Secondary | ICD-10-CM | POA: Diagnosis not present

## 2019-05-20 DIAGNOSIS — F419 Anxiety disorder, unspecified: Secondary | ICD-10-CM | POA: Diagnosis not present

## 2019-05-20 DIAGNOSIS — F1210 Cannabis abuse, uncomplicated: Secondary | ICD-10-CM | POA: Diagnosis not present

## 2019-05-20 DIAGNOSIS — F39 Unspecified mood [affective] disorder: Secondary | ICD-10-CM

## 2019-05-20 NOTE — Progress Notes (Signed)
Virtual Visit via Video Note  I connected with Kayla Murphy on 05/20/19 at 4:00pm EST by a video enabled telemedicine application and verified that I am speaking with the correct person using two identifiers.   I discussed the limitations of evaluation and management by telemedicine and the availability of in person appointments. The patient expressed understanding and agreed to proceed.  History of Present Illness: Patient is referred for individual therapy after completion of PHP and IOP for depression.    Observations/Objective: Patient presents agitated and anxious for her individual therapy session. Patient discussed her psychiatric symptoms and current life events..Patient discussed her current stressors and life events. Patient discussed her initial session with psychiatrist, dr. Lolly Mustache, where he discussed bipolar disorder diagnosis. Educated patient on bipolar. Patient reports her alcohol use has increased and she uses marijuana daily as a coping skill. Discussed alternative coping skills. Patient discussed her impulsive behaviors. Clinician utilized CBT to address thought processes. Clinician provided thought stopping tools, as well as reality testing to provide support and confidence in her decisions.      PLAN: motivation, journaling, Ainsley Spinner building confidence, focus. To send patient gratitude journal.  PLAN: Discuss marijuana and alcohol abuse  Assessment and Plan:  Counselor will continue to meet with patient to address treatment plan goals. Patient will continue to follow recommendations of providers and implement skills learned in session.   Follow Up Instructions:  Counselor will send webex request prior to session. II discussed the assessment and treatment plan with the patient. The patient was provided an opportunity to ask questions and all were answered. The patient agreed with the plan and demonstrated an understanding of the instructions.   The patient was advised to  call back or seek an in-person evaluation if the symptoms worsen or if the condition fails to improve as anticipated.  I provided 45 minutes of non-face-to-face time during this encounter.   Bobetta Korf S, LCAS

## 2019-05-27 ENCOUNTER — Other Ambulatory Visit: Payer: Self-pay

## 2019-05-27 ENCOUNTER — Ambulatory Visit (INDEPENDENT_AMBULATORY_CARE_PROVIDER_SITE_OTHER): Payer: BC Managed Care – PPO | Admitting: Licensed Clinical Social Worker

## 2019-05-27 ENCOUNTER — Encounter (HOSPITAL_COMMUNITY): Payer: Self-pay | Admitting: Licensed Clinical Social Worker

## 2019-05-27 DIAGNOSIS — F1010 Alcohol abuse, uncomplicated: Secondary | ICD-10-CM

## 2019-05-27 DIAGNOSIS — F419 Anxiety disorder, unspecified: Secondary | ICD-10-CM | POA: Diagnosis not present

## 2019-05-27 DIAGNOSIS — F39 Unspecified mood [affective] disorder: Secondary | ICD-10-CM | POA: Diagnosis not present

## 2019-05-27 DIAGNOSIS — F1210 Cannabis abuse, uncomplicated: Secondary | ICD-10-CM

## 2019-05-27 NOTE — Progress Notes (Signed)
Virtual Visit via Video Note  I connected with Kayla Murphy on 05/27/19 at 4:00pm EST by a video enabled telemedicine application and verified that I am speaking with the correct person using two identifiers.   I discussed the limitations of evaluation and management by telemedicine and the availability of in person appointments. The patient expressed understanding and agreed to proceed.  History of Present Illness: Patient is referred for individual therapy after completion of PHP and IOP for depression.    Observations/Objective: Patient presents agitated and anxious for her individual therapy session. Patient discussed her psychiatric symptoms and current life events..Patient discussed her current stressors and effective coping skills. Patient reports she is only sleeping 3 hours per night. Discussed sleep hygiene. Patient has started her new regimen for lamictal, she will increase the medication Wednesday. She does not feel anything yet from the medication.Patient reports her alcohol use remains the same, no increase.she continues to use marijuana daily as a coping skill. Again, discussed alternative coping skills.    PLAN: motivation, journaling, Ainsley Spinner building confidence, focus. To send patient gratitude journal.  PLAN: Discuss marijuana and alcohol abuse  Assessment and Plan:  Counselor will continue to meet with patient to address treatment plan goals. Patient will continue to follow recommendations of providers and implement skills learned in session.   Follow Up Instructions:  Counselor will send webex request prior to session. II discussed the assessment and treatment plan with the patient. The patient was provided an opportunity to ask questions and all were answered. The patient agreed with the plan and demonstrated an understanding of the instructions.   The patient was advised to call back or seek an in-person evaluation if the symptoms worsen or if the condition fails to  improve as anticipated.  I provided 60 minutes of non-face-to-face time during this encounter.   Nyrie Sigal S, LCAS

## 2019-06-03 ENCOUNTER — Telehealth (HOSPITAL_COMMUNITY): Payer: Self-pay | Admitting: Licensed Clinical Social Worker

## 2019-06-03 ENCOUNTER — Other Ambulatory Visit: Payer: Self-pay

## 2019-06-03 ENCOUNTER — Ambulatory Visit (HOSPITAL_COMMUNITY): Payer: BC Managed Care – PPO | Admitting: Licensed Clinical Social Worker

## 2019-06-03 NOTE — Telephone Encounter (Signed)
Pt did not present for her individual webex therapy session. Sent a reminder email to join the session. She did not. Landers Prajapati, LCAS

## 2019-06-10 ENCOUNTER — Other Ambulatory Visit: Payer: Self-pay

## 2019-06-10 ENCOUNTER — Encounter (HOSPITAL_COMMUNITY): Payer: Self-pay | Admitting: Licensed Clinical Social Worker

## 2019-06-10 ENCOUNTER — Ambulatory Visit (INDEPENDENT_AMBULATORY_CARE_PROVIDER_SITE_OTHER): Payer: BC Managed Care – PPO | Admitting: Licensed Clinical Social Worker

## 2019-06-10 DIAGNOSIS — F39 Unspecified mood [affective] disorder: Secondary | ICD-10-CM

## 2019-06-10 DIAGNOSIS — F419 Anxiety disorder, unspecified: Secondary | ICD-10-CM | POA: Diagnosis not present

## 2019-06-10 DIAGNOSIS — F1210 Cannabis abuse, uncomplicated: Secondary | ICD-10-CM

## 2019-06-10 DIAGNOSIS — F1010 Alcohol abuse, uncomplicated: Secondary | ICD-10-CM | POA: Diagnosis not present

## 2019-06-10 NOTE — Progress Notes (Addendum)
Virtual Visit via Video Note  I connected with Kayla Murphy on 06/10/19 at 4:00pm EDT by a video enabled telemedicine application and verified that I am speaking with the correct person using two identifiers.   I discussed the limitations of evaluation and management by telemedicine and the availability of in person appointments. The patient expressed understanding and agreed to proceed.  History of Present Illness: Patient is referred for individual therapy after completion of PHP and IOP for depression.    Observations/Objective: Patient presents depressed and anxious for her individual therapy session. Patient discussed her psychiatric symptoms and current life events..Patient discussed her current stressors and effective coping skills.Reviewed tx plan with patient who verbalized acceptance of the plan. Patient reports she is having relationship issues. Validated and normalized her feelings while redirecting her focus to things that she can control and/or work on such as thought stopping strategies, building trusts again, strengthening coping skills, and working on self.   PLAN: AOD use, . To send patient gratitude journal.   Assessment and Plan:  Counselor will continue to meet with patient to address treatment plan goals. Patient will continue to follow recommendations of providers and implement skills learned in session.   Follow Up Instructions:  Counselor will send webex request prior to session. II discussed the assessment and treatment plan with the patient. The patient was provided an opportunity to ask questions and all were answered. The patient agreed with the plan and demonstrated an understanding of the instructions.   The patient was advised to call back or seek an in-person evaluation if the symptoms worsen or if the condition fails to improve as anticipated.  I provided 60 minutes of non-face-to-face time during this encounter.   Kareem Cathey S, LCAS

## 2019-06-11 ENCOUNTER — Other Ambulatory Visit: Payer: Self-pay

## 2019-06-11 ENCOUNTER — Ambulatory Visit (INDEPENDENT_AMBULATORY_CARE_PROVIDER_SITE_OTHER): Payer: BC Managed Care – PPO | Admitting: Psychiatry

## 2019-06-11 ENCOUNTER — Encounter (HOSPITAL_COMMUNITY): Payer: Self-pay | Admitting: Psychiatry

## 2019-06-11 VITALS — Wt 160.0 lb

## 2019-06-11 DIAGNOSIS — F419 Anxiety disorder, unspecified: Secondary | ICD-10-CM | POA: Diagnosis not present

## 2019-06-11 DIAGNOSIS — F39 Unspecified mood [affective] disorder: Secondary | ICD-10-CM | POA: Diagnosis not present

## 2019-06-11 DIAGNOSIS — F1010 Alcohol abuse, uncomplicated: Secondary | ICD-10-CM | POA: Diagnosis not present

## 2019-06-11 DIAGNOSIS — F1210 Cannabis abuse, uncomplicated: Secondary | ICD-10-CM | POA: Diagnosis not present

## 2019-06-11 MED ORDER — BUPROPION HCL ER (XL) 150 MG PO TB24: 150.0000 mg | ORAL_TABLET | Freq: Every morning | ORAL | 1 refills | Status: DC

## 2019-06-11 MED ORDER — LAMOTRIGINE 100 MG PO TABS: 100.0000 mg | ORAL_TABLET | Freq: Every day | ORAL | 1 refills | Status: DC
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
## 2019-06-17 ENCOUNTER — Encounter (HOSPITAL_COMMUNITY): Payer: Self-pay | Admitting: Licensed Clinical Social Worker

## 2019-06-17 ENCOUNTER — Other Ambulatory Visit: Payer: Self-pay

## 2019-06-17 ENCOUNTER — Ambulatory Visit (INDEPENDENT_AMBULATORY_CARE_PROVIDER_SITE_OTHER): Payer: BC Managed Care – PPO | Admitting: Licensed Clinical Social Worker

## 2019-06-17 DIAGNOSIS — F419 Anxiety disorder, unspecified: Secondary | ICD-10-CM

## 2019-06-17 DIAGNOSIS — F1010 Alcohol abuse, uncomplicated: Secondary | ICD-10-CM | POA: Diagnosis not present

## 2019-06-17 DIAGNOSIS — F39 Unspecified mood [affective] disorder: Secondary | ICD-10-CM | POA: Diagnosis not present

## 2019-06-17 DIAGNOSIS — F1210 Cannabis abuse, uncomplicated: Secondary | ICD-10-CM

## 2019-06-17 NOTE — Progress Notes (Signed)
Virtual Visit via Video Note  I connected with [place] on 06/17/19 at 4:00pm EDT by a video enabled telemedicine application and verified that I am speaking with the correct person using two identifiers.   I discussed the limitations of evaluation and management by telemedicine and the availability of in person appointments. The patient expressed understanding and agreed to proceed.  History of Present Illness: Patient is referred for individual therapy after completion of PHP and IOP for depression.    Observations/Objective: Patient presents depressed and anxious for her individual therapy session. Patient discussed her psychiatric symptoms and current life events..Patient discussed her current stressors and effective coping skills. Patient discusses her alcohol and marijuana use. Her alcohol use is weekends only 2-3 drinks. Her marijuana use is daily 1-2 grams. Today is the first day " i've admitted to problematic use, but I don't want to stop." Used MI to create discrepancy between values and current behavior. Coached patient on the 5 principles of MI.   PLAN: AOD use, . To send patient gratitude journal.   Assessment and Plan:  Counselor will continue to meet with patient to address treatment plan goals. Patient will continue to follow recommendations of providers and implement skills learned in session.   Follow Up Instructions:  Counselor will send webex request prior to session. II discussed the assessment and treatment plan with the patient. The patient was provided an opportunity to ask questions and all were answered. The patient agreed with the plan and demonstrated an understanding of the instructions.   The patient was advised to call back or seek an in-person evaluation if the symptoms worsen or if the condition fails to improve as anticipated.  I provided 60 minutes of non-face-to-face time during this encounter.   Zaliyah Meikle S, LCAS

## 2019-06-25 ENCOUNTER — Encounter (HOSPITAL_COMMUNITY): Payer: Self-pay | Admitting: Licensed Clinical Social Worker

## 2019-06-25 ENCOUNTER — Ambulatory Visit (INDEPENDENT_AMBULATORY_CARE_PROVIDER_SITE_OTHER): Payer: BC Managed Care – PPO | Admitting: Licensed Clinical Social Worker

## 2019-06-25 ENCOUNTER — Other Ambulatory Visit: Payer: Self-pay

## 2019-06-25 DIAGNOSIS — F1210 Cannabis abuse, uncomplicated: Secondary | ICD-10-CM | POA: Insufficient documentation

## 2019-06-25 DIAGNOSIS — F411 Generalized anxiety disorder: Secondary | ICD-10-CM

## 2019-06-25 DIAGNOSIS — F1010 Alcohol abuse, uncomplicated: Secondary | ICD-10-CM | POA: Diagnosis not present

## 2019-06-25 NOTE — Progress Notes (Addendum)
Virtual Visit via Video Note  I connected with Kayla Murphy on 06/25/19 at 4:00pm EDT by a video enabled telemedicine application and verified that I am speaking with the correct person using two identifiers.   I discussed the limitations of evaluation and management by telemedicine and the availability of in person appointments. The patient expressed understanding and agreed to proceed.  History of Present Illness: Patient is referred for individual therapy after completion of PHP and IOP for depression.    Observations/Objective: Patient presents depressed and anxious for her individual therapy session. Patient discussed her psychiatric symptoms and current life events..Patient discussed her current stressors and effective coping skills. Patient discussed her difficulty with communication in relationships.Coached patient on effective communication skills. Patient self reports she is aggressive in her communication and how it affects her relationships. Role played assertive communication. Patient reports "I need guidance to help me to adult." Suggested patient get a Dance movement psychotherapist. Patient was in agreement of the suggestion.Patient discusses her alcohol and marijuana use. Her alcohol use is weekends only 2-3 drinks. Her marijuana use is daily 1-2 grams. Used MI to create discrepancy between values and current behavior.   PLAN: 7 days of active listening . send patient gratitude journal?.[facility] bill   Assessment and Plan:  Counselor will continue to meet with patient to address treatment plan goals. Patient will continue to follow recommendations of providers and implement skills learned in session.   Follow Up Instructions:  Counselor will send webex request prior to session. II discussed the assessment and treatment plan with the patient. The patient was provided an opportunity to ask questions and all were answered. The patient agreed with the plan and demonstrated an understanding of the instructions.    The patient was advised to call back or seek an in-person evaluation if the symptoms worsen or if the condition fails to improve as anticipated.  I provided 60 minutes of non-face-to-face time during this encounter.   Audra Bellard S, LCAS

## 2019-07-02 ENCOUNTER — Encounter (HOSPITAL_COMMUNITY): Payer: Self-pay | Admitting: Licensed Clinical Social Worker

## 2019-07-02 ENCOUNTER — Other Ambulatory Visit: Payer: Self-pay

## 2019-07-02 ENCOUNTER — Ambulatory Visit (INDEPENDENT_AMBULATORY_CARE_PROVIDER_SITE_OTHER): Payer: BC Managed Care – PPO | Admitting: Licensed Clinical Social Worker

## 2019-07-02 DIAGNOSIS — F39 Unspecified mood [affective] disorder: Secondary | ICD-10-CM

## 2019-07-02 DIAGNOSIS — F1210 Cannabis abuse, uncomplicated: Secondary | ICD-10-CM | POA: Diagnosis not present

## 2019-07-02 DIAGNOSIS — F419 Anxiety disorder, unspecified: Secondary | ICD-10-CM | POA: Diagnosis not present

## 2019-07-02 DIAGNOSIS — F1010 Alcohol abuse, uncomplicated: Secondary | ICD-10-CM | POA: Diagnosis not present

## 2019-07-02 NOTE — Progress Notes (Signed)
Virtual Visit via Video Note  I connected with [place] on 07/02/19 at 4:00pm EDT by a video enabled telemedicine application and verified that I am speaking with the correct person using two identifiers.   I discussed the limitations of evaluation and management by telemedicine and the availability of in person appointments. The patient expressed understanding and agreed to proceed.  History of Present Illness: Patient is referred for individual therapy after completion of PHP and IOP for depression.    Observations/Objective: Patient presents depressed and anxious for her individual therapy session. Patient discussed her psychiatric symptoms and current life events..Patient discussed her current stressors and effective coping skills. Patient reports "relationships are hard." Validated her feelings. Cln provided psychoeducation on a healthy relationship: communication, respect, boundaries, trust, supportive. Patient continues to struggle with her self esteem which affects her relationships. "I don't love myself, I don't want to be alone." Cln provided psychoeducation on self love, loving yourself first, teaching people how to treat you. Patient leaves this weekend for a mini vacation to [place] with friends. Cln provided quick guide to active listening and assertive communication.   Patient discusses her alcohol and marijuana use. Her alcohol use is weekends only 2-3 drinks. Her marijuana use is daily 1-2 grams. Used MI to create discrepancy between values and current behavior.   PLAN: 7 days of active listening . send patient gratitude journal?.[facility] bill. Alcohol and marijuana use, MI to create discrepancy   Assessment and Plan:  Counselor will continue to meet with patient to address treatment plan goals. Patient will continue to follow recommendations of providers and implement skills learned in session.   Follow Up Instructions:  Counselor will send webex request prior to session. II  discussed the assessment and treatment plan with the patient. The patient was provided an opportunity to ask questions and all were answered. The patient agreed with the plan and demonstrated an understanding of the instructions.   The patient was advised to call back or seek an in-person evaluation if the symptoms worsen or if the condition fails to improve as anticipated.  I provided 60 minutes of non-face-to-face time during this encounter.   Audine Mangione S, LCAS

## 2019-07-09 ENCOUNTER — Encounter (HOSPITAL_COMMUNITY): Payer: Self-pay | Admitting: Licensed Clinical Social Worker

## 2019-07-09 ENCOUNTER — Ambulatory Visit (INDEPENDENT_AMBULATORY_CARE_PROVIDER_SITE_OTHER): Payer: BC Managed Care – PPO | Admitting: Licensed Clinical Social Worker

## 2019-07-09 ENCOUNTER — Other Ambulatory Visit: Payer: Self-pay

## 2019-07-09 DIAGNOSIS — F1210 Cannabis abuse, uncomplicated: Secondary | ICD-10-CM | POA: Diagnosis not present

## 2019-07-09 DIAGNOSIS — F411 Generalized anxiety disorder: Secondary | ICD-10-CM

## 2019-07-09 DIAGNOSIS — F39 Unspecified mood [affective] disorder: Secondary | ICD-10-CM

## 2019-07-09 DIAGNOSIS — F1010 Alcohol abuse, uncomplicated: Secondary | ICD-10-CM | POA: Diagnosis not present

## 2019-07-09 NOTE — Progress Notes (Signed)
Virtual Visit via Video Note  I connected with [place] on 07/09/19 at 4:00pm EDT by a video enabled telemedicine application and verified that I am speaking with the correct person using two identifiers.   I discussed the limitations of evaluation and management by telemedicine and the availability of in person appointments. The patient expressed understanding and agreed to proceed.  History of Present Illness: Patient is referred for individual therapy after completion of PHP and IOP for depression.    Observations/Objective: Patient presents WNL for her individual therapy session. Patient discussed her psychiatric symptoms and current life events..Patient discussed her current stressors and effective coping skills.Patient just returned from a mini-vacation in [place] She was very upbeat and ready to "work on myself and let go of my past." Assisted patient with identifying her personal self care. Assisted patient with identifying SMART goals and strategies to assist her with self care.   PLAN: 7 days of active listening . send patient gratitude journal?.[facility] bill. Alcohol and marijuana use, MI to create discrepancy   Assessment and Plan:  Counselor will continue to meet with patient to address treatment plan goals. Patient will continue to follow recommendations of providers and implement skills learned in session.   Follow Up Instructions:  Counselor will send webex request prior to session. II discussed the assessment and treatment plan with the patient. The patient was provided an opportunity to ask questions and all were answered. The patient agreed with the plan and demonstrated an understanding of the instructions.   The patient was advised to call back or seek an in-person evaluation if the symptoms worsen or if the condition fails to improve as anticipated.  I provided 45 minutes of non-face-to-face time during this encounter.   Anaijah Augsburger S, LCAS

## 2019-07-23 ENCOUNTER — Other Ambulatory Visit: Payer: Self-pay

## 2019-07-23 ENCOUNTER — Ambulatory Visit (HOSPITAL_COMMUNITY): Payer: BC Managed Care – PPO | Admitting: Licensed Clinical Social Worker

## 2019-07-24 ENCOUNTER — Telehealth (HOSPITAL_COMMUNITY): Payer: Self-pay | Admitting: Licensed Clinical Social Worker

## 2019-07-24 NOTE — Telephone Encounter (Signed)
Pt did not present for her virtual therapy session. Emailed reminder to patient. She did not join. Kayla Murphy, LCAS

## 2019-07-25 ENCOUNTER — Other Ambulatory Visit: Payer: Self-pay

## 2019-07-25 ENCOUNTER — Telehealth (HOSPITAL_COMMUNITY): Payer: BC Managed Care – PPO | Admitting: Psychiatry

## 2019-07-30 ENCOUNTER — Encounter (HOSPITAL_COMMUNITY): Payer: Self-pay | Admitting: Licensed Clinical Social Worker

## 2019-07-30 ENCOUNTER — Ambulatory Visit (INDEPENDENT_AMBULATORY_CARE_PROVIDER_SITE_OTHER): Payer: BC Managed Care – PPO | Admitting: Licensed Clinical Social Worker

## 2019-07-30 ENCOUNTER — Other Ambulatory Visit: Payer: Self-pay

## 2019-07-30 DIAGNOSIS — F419 Anxiety disorder, unspecified: Secondary | ICD-10-CM | POA: Diagnosis not present

## 2019-07-30 DIAGNOSIS — F39 Unspecified mood [affective] disorder: Secondary | ICD-10-CM

## 2019-07-30 DIAGNOSIS — F1010 Alcohol abuse, uncomplicated: Secondary | ICD-10-CM

## 2019-07-30 DIAGNOSIS — F1210 Cannabis abuse, uncomplicated: Secondary | ICD-10-CM

## 2019-07-30 NOTE — Progress Notes (Signed)
Virtual Visit via Video Note  I connected with Kayla Murphy on 07/30/19 at 4:00pm EDT by a video enabled telemedicine application and verified that I am speaking with the correct person using two identifiers.   I discussed the limitations of evaluation and management by telemedicine and the availability of in person appointments. The patient expressed understanding and agreed to proceed.  History of Present Illness: Patient is referred for individual therapy after completion of PHP and IOP for depression.    Observations/Objective: Patient presents anxious and frustrated for her individual therapy session. Patient discussed her psychiatric symptoms and current life events..Patient discussed her current stressors and effective coping skills. Patient missed her last appointment with clinician and last appointment with Dr. Lolly Mustache. Inquired about her desire to continue in therapy/pschiatry. She tried to blame her missed appointments on others, not taking responsibility for the appointments. Patient reports she is having trouble sleeping because she can't turn her brain off. Cln provided psychoeducation on sleep hygiene, medication adherence. Patient reports she still struggles with time management.  Explored time management skills with patient. Patient reports she continues to use marijuana daily to "wind down."    PLAN: School contract Alcohol and marijuana use, MI to create discrepancy   Assessment and Plan:  Counselor will continue to meet with patient to address treatment plan goals. Patient will continue to follow recommendations of providers and implement skills learned in session.   Follow Up Instructions:  Counselor will send webex request prior to session. II discussed the assessment and treatment plan with the patient. The patient was provided an opportunity to ask questions and all were answered. The patient agreed with the plan and demonstrated an understanding of the instructions.   The  patient was advised to call back or seek an in-person evaluation if the symptoms worsen or if the condition fails to improve as anticipated.  I provided 45 minutes of non-face-to-face time during this encounter.   Weber Monnier S, LCAS

## 2019-08-06 ENCOUNTER — Ambulatory Visit (HOSPITAL_COMMUNITY): Payer: BC Managed Care – PPO | Admitting: Licensed Clinical Social Worker

## 2019-08-06 ENCOUNTER — Telehealth (HOSPITAL_COMMUNITY): Payer: Self-pay | Admitting: Licensed Clinical Social Worker

## 2019-08-06 ENCOUNTER — Other Ambulatory Visit: Payer: Self-pay

## 2019-08-06 NOTE — Telephone Encounter (Signed)
Pt did not present for virtual therapy session. Called patient and phone went straight to vm and unable to leave message. Kayla Murphy, LCAS

## 2019-08-21 ENCOUNTER — Ambulatory Visit (INDEPENDENT_AMBULATORY_CARE_PROVIDER_SITE_OTHER): Payer: BC Managed Care – PPO | Admitting: Licensed Clinical Social Worker

## 2019-08-21 ENCOUNTER — Other Ambulatory Visit: Payer: Self-pay

## 2019-08-21 ENCOUNTER — Encounter (HOSPITAL_COMMUNITY): Payer: Self-pay | Admitting: Licensed Clinical Social Worker

## 2019-08-21 DIAGNOSIS — F1210 Cannabis abuse, uncomplicated: Secondary | ICD-10-CM

## 2019-08-21 DIAGNOSIS — F1010 Alcohol abuse, uncomplicated: Secondary | ICD-10-CM

## 2019-08-21 DIAGNOSIS — F39 Unspecified mood [affective] disorder: Secondary | ICD-10-CM

## 2019-08-21 NOTE — Progress Notes (Signed)
Virtual Visit via Video Note  I connected with Kayla Murphy on 08/21/19 at 4:00pm EDT by a video enabled telemedicine application and verified that I am speaking with the correct person using two identifiers.   I discussed the limitations of evaluation and management by telemedicine and the availability of in person appointments. The patient expressed understanding and agreed to proceed.  LOCATION: Patient: school Provider: home  History of Present Illness: Patient is referred for individual therapy after completion of PHP and IOP for depression.    Observations/Objective: Patient presents anxious and frustrated for her individual therapy session. Patient discussed her psychiatric symptoms and current life events..Patient discussed her current stressors and effective coping skills. Patient did not join Caregility so called patient to ask her to join with a new link. Patient reports "I have been ejected from my school because of a disagreement with me & another teacher." I went to the main office and the incident is being investigated. "I am looking into other schools to work at in the fall." Used Socratic questions. Depression Scale: 0-10 (10 being the worse depression), 7-8. Asked open ended questions. No S/I or H/I. After 45 minutes the patient left the caregility session and did not rejoin.  PLAN: Continue in therapy?   Assessment and Plan:  Counselor will continue to meet with patient to address treatment plan goals. Patient will continue to follow recommendations of providers and implement skills learned in session.   Follow Up Instructions:  Counselor will send webex request prior to session. II discussed the assessment and treatment plan with the patient. The patient was provided an opportunity to ask questions and all were answered. The patient agreed with the plan and demonstrated an understanding of the instructions.   The patient was advised to call back or seek an in-person  evaluation if the symptoms worsen or if the condition fails to improve as anticipated.  I provided 45 minutes of non-face-to-face time during this encounter.   Chiyeko Ferre S, LCAS

## 2020-01-07 ENCOUNTER — Other Ambulatory Visit (HOSPITAL_COMMUNITY): Payer: Self-pay | Admitting: Psychiatry

## 2020-01-07 DIAGNOSIS — F419 Anxiety disorder, unspecified: Secondary | ICD-10-CM

## 2020-02-10 ENCOUNTER — Other Ambulatory Visit (HOSPITAL_COMMUNITY): Payer: Self-pay | Admitting: *Deleted

## 2020-02-10 ENCOUNTER — Telehealth (HOSPITAL_COMMUNITY): Payer: Self-pay | Admitting: *Deleted

## 2020-02-10 DIAGNOSIS — F419 Anxiety disorder, unspecified: Secondary | ICD-10-CM

## 2020-02-10 DIAGNOSIS — F39 Unspecified mood [affective] disorder: Secondary | ICD-10-CM

## 2020-02-10 MED ORDER — LAMOTRIGINE 100 MG PO TABS: 100.0000 mg | ORAL_TABLET | Freq: Every day | ORAL | 0 refills | Status: DC

## 2020-02-10 MED ORDER — BUPROPION HCL ER (XL) 150 MG PO TB24: 150.0000 mg | ORAL_TABLET | Freq: Every morning | ORAL | 0 refills | Status: DC

## 2020-02-10 NOTE — Telephone Encounter (Signed)
Pt called requesting refill of the Lamictal and Wellbutrin. Pt last seen by you in 3/21. Last appointment no show in may. Pt does now have an appointment for tomorrow. States that she has been taking the meds? But cannot name provider. Please review.

## 2020-02-10 NOTE — Telephone Encounter (Signed)
As per last note she supposed to take Wellbutrin XL 150 mg and Lamictal 100 mg daily.  If she has been taking these medicine consistently than we can provide 2-week supply however if patient is not taking then she should wait for tomorrow's appointment as medicine dose may be adjusted.

## 2020-02-11 ENCOUNTER — Other Ambulatory Visit: Payer: Self-pay

## 2020-02-11 ENCOUNTER — Encounter (HOSPITAL_COMMUNITY): Payer: Self-pay | Admitting: Psychiatry

## 2020-02-11 ENCOUNTER — Ambulatory Visit (INDEPENDENT_AMBULATORY_CARE_PROVIDER_SITE_OTHER): Payer: 59 | Admitting: Psychiatry

## 2020-02-11 VITALS — Wt 170.0 lb

## 2020-02-11 DIAGNOSIS — F1010 Alcohol abuse, uncomplicated: Secondary | ICD-10-CM

## 2020-02-11 DIAGNOSIS — F39 Unspecified mood [affective] disorder: Secondary | ICD-10-CM | POA: Diagnosis not present

## 2020-02-11 DIAGNOSIS — F419 Anxiety disorder, unspecified: Secondary | ICD-10-CM

## 2020-02-11 DIAGNOSIS — F1210 Cannabis abuse, uncomplicated: Secondary | ICD-10-CM

## 2020-02-11 MED ORDER — ARIPIPRAZOLE 5 MG PO TABS: 5.0000 mg | ORAL_TABLET | Freq: Every day | ORAL | 0 refills | Status: DC

## 2020-02-11 MED ORDER — BUPROPION HCL ER (XL) 150 MG PO TB24: 150.0000 mg | ORAL_TABLET | Freq: Every morning | ORAL | 0 refills | Status: DC

## 2020-02-11 NOTE — Progress Notes (Signed)
Virtual Visit via Telephone Note  I connected with Kayla Murphy on 02/11/20 at  2:40 PM EST by telephone and verified that I am speaking with the correct person using two identifiers.  Location: Patient: In Car Provider: Home Office   I discussed the limitations, risks, security and privacy concerns of performing an evaluation and management service by telephone and the availability of in person appointments. I also discussed with the patient that there may be a patient responsible charge related to this service. The patient expressed understanding and agreed to proceed.   History of Present Illness: Patient is evaluated by phone session.  She was last evaluated more than 6 months ago.  Patient admitted that she had missed 3 appointments and for the same reason she is no longer in therapy with Lavada Mesi.  She reported lately increased irritability, mood swings, anger.  She gets easily frustrated and having arguments with a Radio broadcast assistant.  She also reported continues to smoke marijuana almost daily and alcohol on occasions.  Her last drink was few days ago but she denies any binge, intoxication or any blackouts.  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  She admitted having issues keeping the relationship with a coworker.  Though she denies any paranoia or any hallucination but reported highs and lows in her mood.  She feels anxious.  She is not sure if the Lamictal is working but she also endorsed not consistent with the medication.  She denies any rash, itching, tremors or shakes.  On the last visit we have recommended to increase the Lamictal which she gets.  However she also endorsed prescription the dose because she ran.  She had a plan to spend Thanksgiving with her grandmother who lives in [place].  She denies any suicidal thoughts or any recent self abusive behavior.  She sleeps on and off.  She is not taking trazodone because she comes home very late and does  not want to take anything that makes her groggy.  However she does admit alcohol keep her calm down.  Past Psychiatric History: H/O depression, hi and lows, irritability and cutting in early age. H/O strong suicidal thoughts when tried to cut her self, drown herself, holding her breath and droveherself but not able to succeed. H/O impulsive buying, anger, speeding tickets. H/O inpatient in 2018at [facility] as wanted to crash car but did not do it. Given Wellbutrin but consistent with the compliance. Did PHP/IOP in 2020. H/O THC and ETOH.   Psychiatric Specialty Exam: Physical Exam  Review of Systems  Psychiatric/Behavioral:       THC and ETOH use    Weight 170 lb (77.1 kg).There is no height or weight on file to calculate BMI.  General Appearance: NA  Eye Contact:  NA  Speech:  Slow  Volume:  Decreased  Mood:  Depressed and Irritable  Affect:  NA  Thought Process:  Descriptions of Associations: Intact  Orientation:  Full (Time, Place, and Person)  Thought Content:  Rumination  Suicidal Thoughts:  No  Homicidal Thoughts:  No  Memory:  Immediate;   Good Recent;   Fair Remote;   Fair  Judgement:  Fair  Insight:  Shallow  Psychomotor Activity:  NA  Concentration:  Concentration: Fair and Attention Span: Fair  Recall:  [employer] of Knowledge:  Good  Language:  Good  Akathisia:  No  Handed:  Right  AIMS (if indicated):     Assets:  Communication  Skills Desire for Improvement Housing Transportation  ADL's:  Intact  Cognition:  WNL  Sleep:        Assessment and Plan: Mood disorder NOS.  Mild cannabis use, anxiety.  Alcohol use.  I talked with the patient about noncompliance with medication and therapy.  She promised to keep the appointment in the future.  I do recommend that she should restart therapy as she continues to have issues to controlling her anger and mood.  We talked about stopping substance use and I also offered naltrexone to help stop smoking but patient feels  that she does not need it and she could control the substance on her own.  We talked about trying Abilify since patient feels that Lamictal did not help as much.  I recommend to try Abilify 5 mg daily, continue Wellbutrin XL 150 mg daily and patient will referred to see therapist for coping skills.  Discussed safety concern that anytime having active suicidal thoughts or homicidal thought that she need to call 911 or go to local emergency room.  Follow-up in 3 weeks.  Follow Up Instructions:    I discussed the assessment and treatment plan with the patient. The patient was provided an opportunity to ask questions and all were answered. The patient agreed with the plan and demonstrated an understanding of the instructions.   The patient was advised to call back or seek an in-person evaluation if the symptoms worsen or if the condition fails to improve as anticipated.  I provided 23 minutes of non-face-to-face time during this encounter.   Cleotis Nipper, MD

## 2020-02-20 ENCOUNTER — Ambulatory Visit (HOSPITAL_COMMUNITY): Payer: 59 | Admitting: Licensed Clinical Social Worker

## 2020-02-20 ENCOUNTER — Other Ambulatory Visit: Payer: Self-pay

## 2020-02-22 IMAGING — CT CT ABDOMEN AND PELVIS WITH CONTRAST
2 of 4 series · 16 of 46 positions shown, 18 images · IV contrast (Omni 300)
Comparison: None.

CLINICAL DATA: Abdominal pain, nausea, fever, appendicitis
suspected

EXAM:
CT ABDOMEN AND PELVIS WITH CONTRAST
TECHNIQUE: Multidetector CT imaging of the abdomen and pelvis was performed
using the standard protocol following bolus administration of
intravenous contrast.
CONTRAST:  100mL OMNIPAQUE IOHEXOL 300 MG/ML  SOLN

[Series 3: a/p w/ 5mm · axial · 0.80mm/px · z∈[+854,+1259]mm · 13 of 89 slices shown, 15 images]
[im 4/89  soft-tissue]
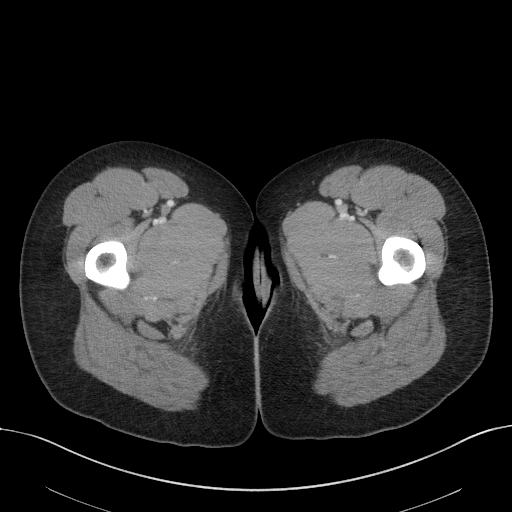
[im 4/89  bone]
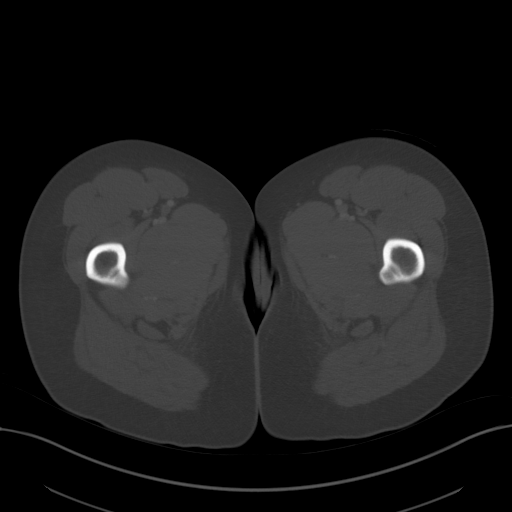
[im 11/89  soft-tissue]
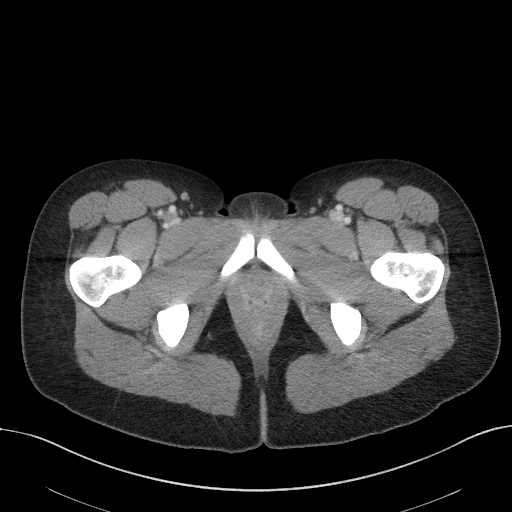
[im 18/89  soft-tissue]
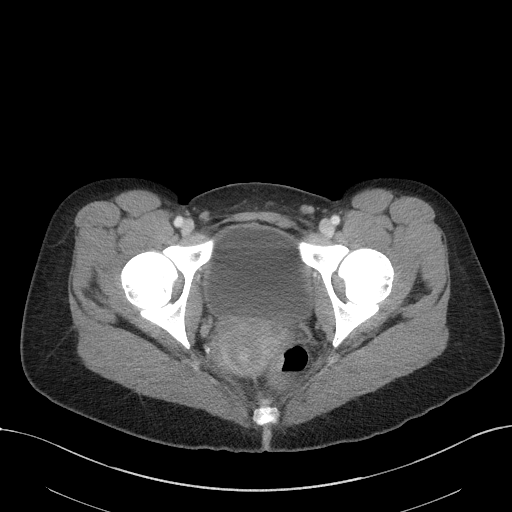
[im 25/89  soft-tissue]
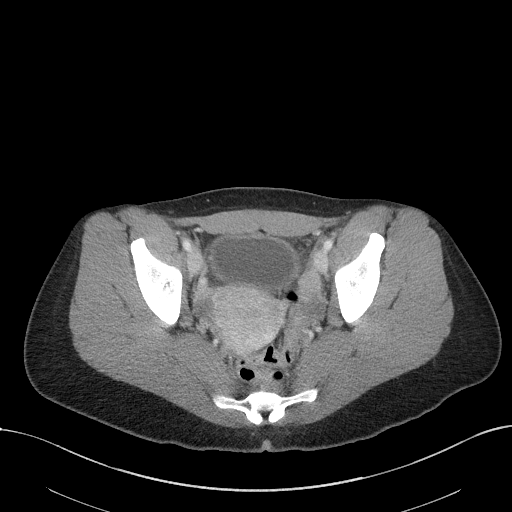
[im 32/89  soft-tissue]
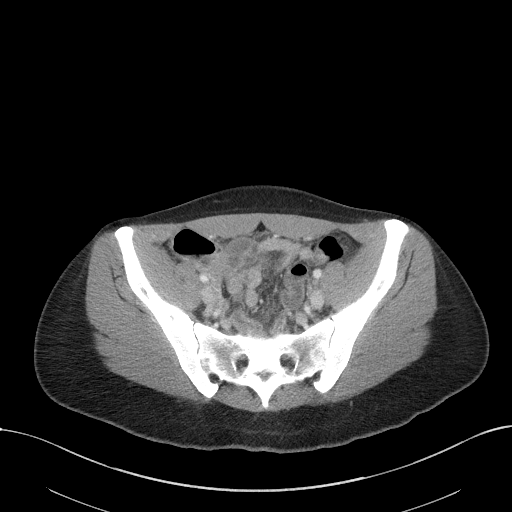
[im 39/89  soft-tissue]
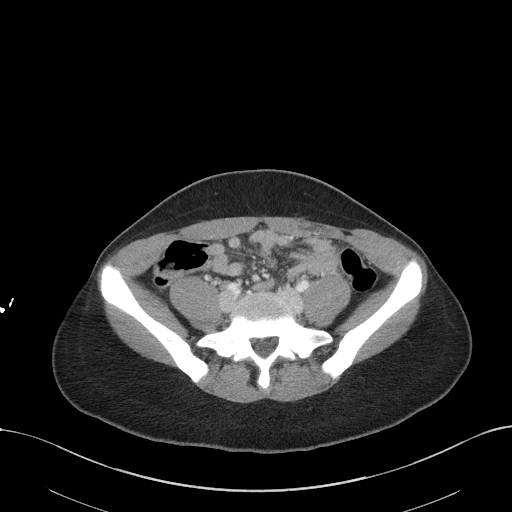
[im 46/89  soft-tissue]
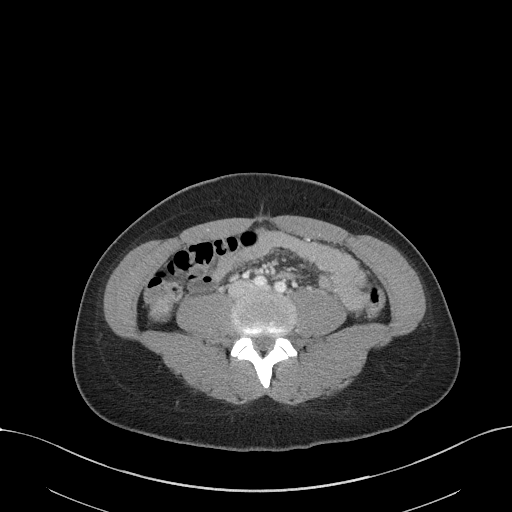
[im 50/89  soft-tissue]
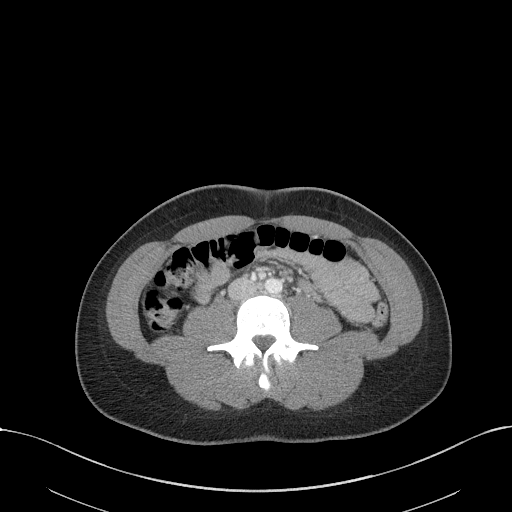
[im 57/89  soft-tissue]
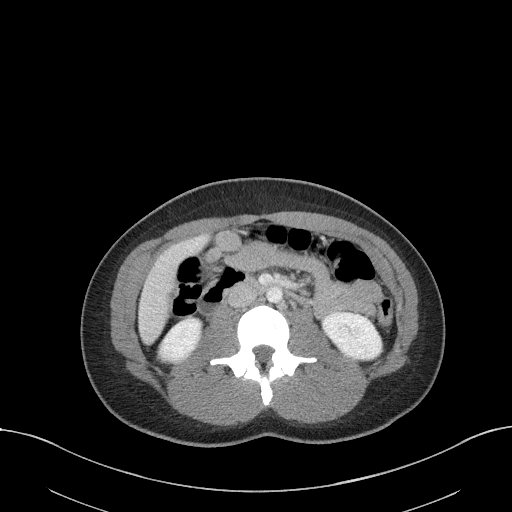
[im 57/89  bone]
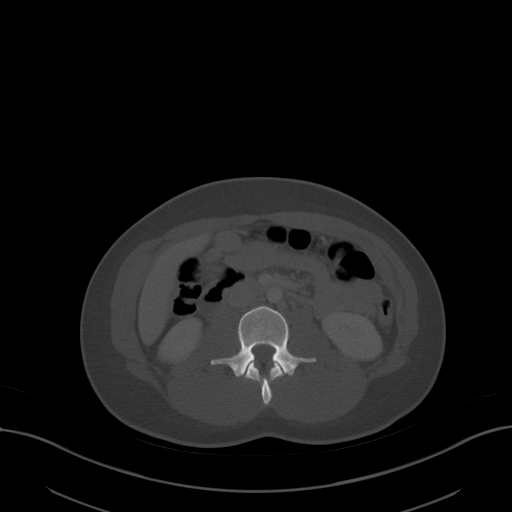
[im 64/89  soft-tissue]
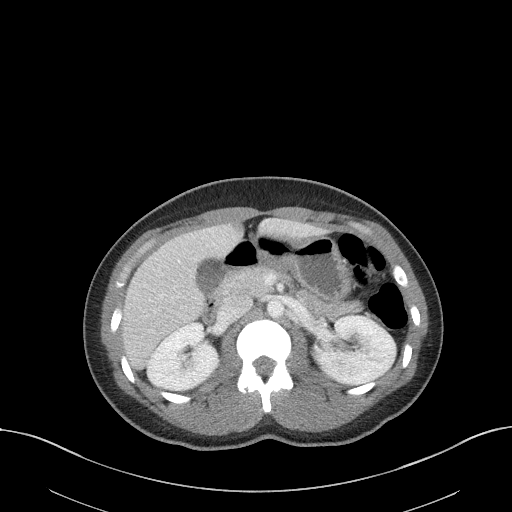
[im 71/89  soft-tissue]
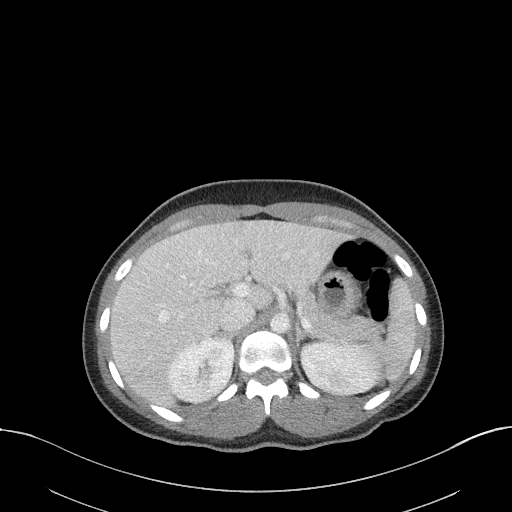
[im 78/89  soft-tissue]
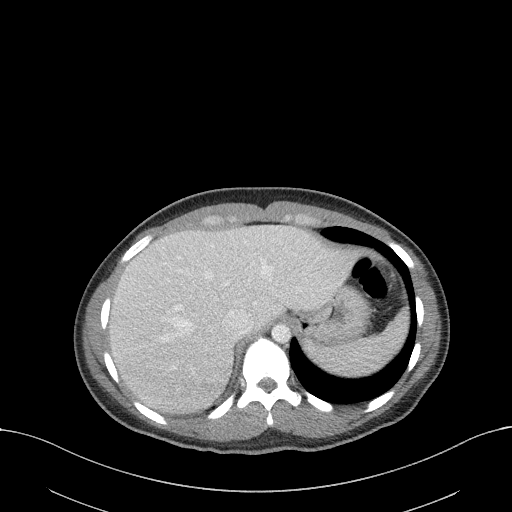
[im 85/89  soft-tissue]
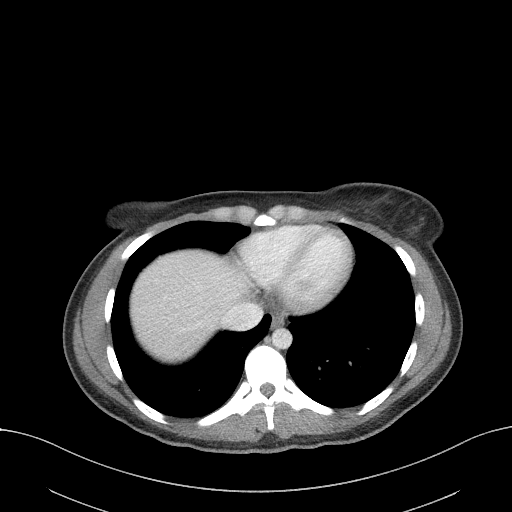

[Series 6: a/p w/ cor · coronal · 0.72mm/px · 3 of 150 slices shown]
[im 50/150  soft-tissue]
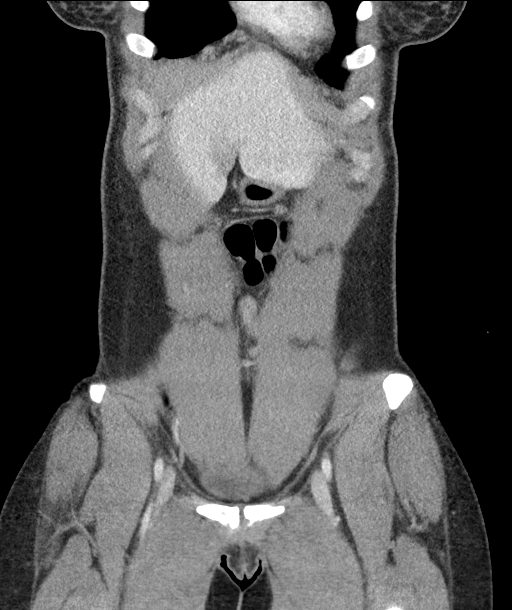
[im 67/150  soft-tissue]
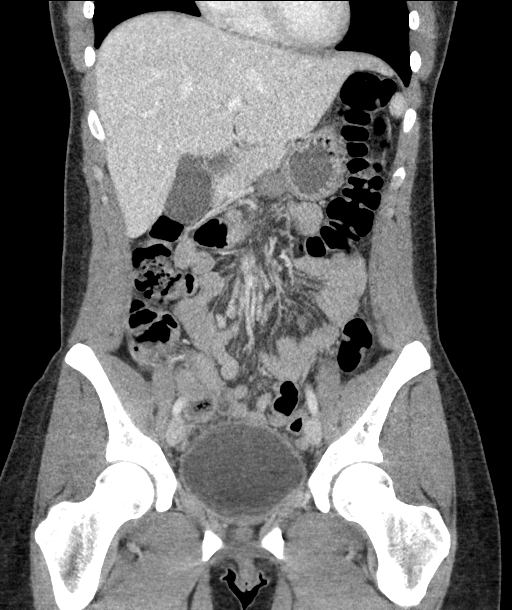
[im 83/150  soft-tissue]
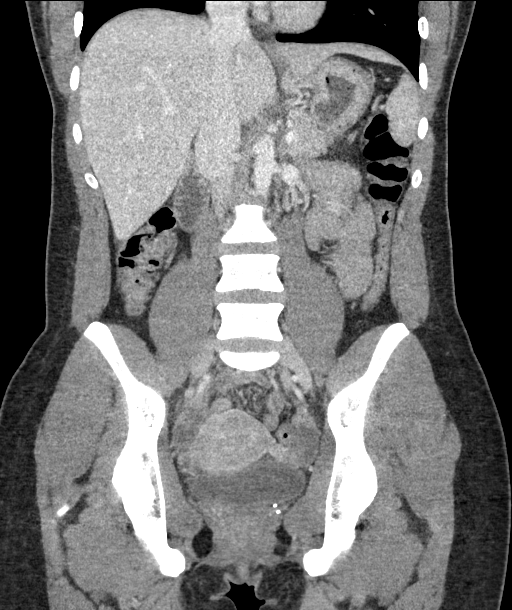

[16 of 46 positions shown; findings below may reference images not displayed]

FINDINGS: Lower chest: No acute abnormality.

Hepatobiliary: No focal liver abnormality is seen. No gallstones,
gallbladder wall thickening, or biliary dilatation.

Pancreas: Unremarkable. No pancreatic ductal dilatation or
surrounding inflammatory changes.

Spleen: Normal in size without focal abnormality.

Adrenals/Urinary Tract: Adrenal glands are unremarkable. Kidneys are
normal, without renal calculi, focal lesion, or hydronephrosis.
Bladder is unremarkable.

Stomach/Bowel: Stomach is within normal limits. Appendix appears
normal. No evidence of bowel wall thickening, distention, or
inflammatory changes.

Vascular/Lymphatic: No significant vascular findings are present. No
enlarged abdominal or pelvic lymph nodes.

Reproductive: No mass or other abnormality. Bilateral ovarian cysts
and/or follicles

Other: No abdominal wall hernia or abnormality. Trace free fluid in
the low pelvis.

Musculoskeletal: No acute or significant osseous findings.
IMPRESSION: 1. No CT findings of the abdomen or pelvis to explain abdominal
pain, nausea and vomiting, or fever. Normal appendix.

2. Trace free fluid in the low pelvis, likely functional in the
reproductive age setting.

## 2020-02-26 ENCOUNTER — Telehealth (HOSPITAL_COMMUNITY): Payer: 59 | Admitting: Psychiatry

## 2020-02-26 ENCOUNTER — Other Ambulatory Visit: Payer: Self-pay

## 2020-02-26 ENCOUNTER — Encounter (HOSPITAL_COMMUNITY): Payer: Self-pay | Admitting: Psychiatry

## 2020-03-27 ENCOUNTER — Telehealth (HOSPITAL_COMMUNITY): Payer: Self-pay | Admitting: *Deleted

## 2020-03-27 NOTE — Telephone Encounter (Signed)
Pt's mother called requesting refill of pt medication and asking about titration of the Lamictal. Pt was no show on last appointment and note says she was not taking Lamictal. Mother states that pt has been taking for at least a couple weeks as she must have had leftover medication from not taking, and mother was adamant about titration. Writer advised mother physician may want until appointment to reassess medications. Writer advised mother that pt can take 50mg  times one week and then to 25 mg for a week then d/c if pt is taking her other medications if refilled. Writer connected mother to front desk for an appointment, which is now in place for 04/01/20.

## 2020-03-28 ENCOUNTER — Other Ambulatory Visit (HOSPITAL_COMMUNITY): Payer: Self-pay | Admitting: Psychiatry

## 2020-03-28 DIAGNOSIS — F39 Unspecified mood [affective] disorder: Secondary | ICD-10-CM

## 2020-03-30 NOTE — Telephone Encounter (Signed)
I will discuss with patient on her next appointment. She need to keep her tomorrow`s appointment.

## 2020-04-01 ENCOUNTER — Other Ambulatory Visit: Payer: Self-pay

## 2020-04-01 ENCOUNTER — Encounter (HOSPITAL_COMMUNITY): Payer: Self-pay | Admitting: Psychiatry

## 2020-04-01 ENCOUNTER — Telehealth (INDEPENDENT_AMBULATORY_CARE_PROVIDER_SITE_OTHER): Payer: 59 | Admitting: Psychiatry

## 2020-04-01 VITALS — Wt 164.0 lb

## 2020-04-01 DIAGNOSIS — F1210 Cannabis abuse, uncomplicated: Secondary | ICD-10-CM

## 2020-04-01 DIAGNOSIS — F419 Anxiety disorder, unspecified: Secondary | ICD-10-CM | POA: Diagnosis not present

## 2020-04-01 DIAGNOSIS — F39 Unspecified mood [affective] disorder: Secondary | ICD-10-CM | POA: Diagnosis not present

## 2020-04-01 MED ORDER — BUPROPION HCL ER (XL) 150 MG PO TB24: 150.0000 mg | ORAL_TABLET | Freq: Every morning | ORAL | 0 refills | Status: DC

## 2020-04-01 MED ORDER — LAMOTRIGINE 100 MG PO TABS: 100.0000 mg | ORAL_TABLET | Freq: Every day | ORAL | 0 refills | Status: DC

## 2020-04-01 NOTE — Progress Notes (Signed)
Virtual Visit via Telephone Note  I connected with [place] Profeta on 04/01/20 at 10:20 AM EST by telephone and verified that I am speaking with the correct person using two identifiers.  Location: Patient: Home Provider: Home Office   I discussed the limitations, risks, security and privacy concerns of performing an evaluation and management service by telephone and the availability of in person appointments. I also discussed with the patient that there may be a patient responsible charge related to this service. The patient expressed understanding and agreed to proceed.   History of Present Illness: Patient is evaluated by phone session. She wanted her mother Alfonse Ras to be on the conference call. Her mother joined the phone session. Earlier the patient had called to refill the Lamictal which was discontinued on her last session as patient told that she is not taking as not feeling any improvement. Today patient told that she was not consistent during the Lamictal but now for the past 2 weeks she is taking 100 mg daily. That prescription was written in March. She never picked up the Abilify which was given on the last visit. Her mother also reported that patient has been lying and not consistent with the treatment, follow-up but now since the new year decided to change her life and wants to get better. Mother endorsed that the past 2 weeks she is taking Lamictal and noticed improvement in her mood and irritability. Patient's mother had a lot of questions about the medication, diagnosis, prognosis and these questions were addressed. We also talked about noncompliant and not taking the responsibility of the appointments, therapy and medication. Patient promised that she will work on her issues. She had stopped smoking and marijuana. She is still having residual mood lability and irritability but denies any paranoia, hallucination or any suicidal thoughts. She feels anxious and nervous and sometimes gets  irritable but denies any rash or any itching from the Lamictal. Her appetite is okay and her weight is stable. Patient has a history of impulsive behavior with a speeding tickets. 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   Past Psychiatric History: H/Odepression,hi and lows,irritability and cutting in early age. H/Ostrong suicidal thoughts when tried to cut her self, drown herself, holding her breath and droveherself but not able to succeed. H/Oimpulsive buying, anger, speeding tickets. [facility] but did not do it.GivenWellbutrinbutconsistent with the compliance. Did PHP/IOP in 2020. H/O THC and ETOH.  Psychiatric Specialty Exam: Physical Exam  Review of Systems  Weight 164 lb (74.4 kg).There is no height or weight on file to calculate BMI.  General Appearance: NA  Eye Contact:  NA  Speech:  Normal Rate  Volume:  Increased  Mood:  Irritable  Affect:  NA  Thought Process:  Descriptions of Associations: Intact  Orientation:  Full (Time, Place, and Person)  Thought Content:  Rumination  Suicidal Thoughts:  No  Homicidal Thoughts:  No  Memory:  Immediate;   Good Recent;   Good Remote;   Fair  Judgement:  Fair  Insight:  Shallow  Psychomotor Activity:  NA  Concentration:  Concentration: Fair and Attention Span: Fair  Recall:  [employer] of Knowledge:  Good  Language:  Good  Akathisia:  No  Handed:  Right  AIMS (if indicated):     Assets:  Communication Skills Desire for Improvement Housing Social Support Transportation  ADL's:  Intact  Cognition:  WNL  Sleep:  Assessment and Plan: Mood disorder NOS. Rule out bipolar disorder type I. Mild cannabis use. Anxiety.  I had a long discussion with the patient and her mother Alfonse Ras. Mother has a lot of questions which were addressed. We talked about consistency with the medication related to her time to take and taking every day will help patient  symptoms. Patient had stopped smoking marijuana and drinking and hoping that she remained sober. So far she has no tremors or shakes rash or any itching. I recommend to continue Lamictal 100 mg since she started 2 weeks ago. We'll follow-up in 3 weeks and we'll consider increasing the dose to 150 if there is no concern. Continue Wellbutrin XL 150 mg daily. Patient is not taking trazodone for now. Follow up in 3 weeks. Discussed safety concerns or any time having active suicidal thoughts or homicidal halogen need to call 911 or go to the local emergency room. Discussed medication side effects specially if she developed a rash and she needed to stop the medication immediately.  Follow Up Instructions:    I discussed the assessment and treatment plan with the patient. The patient was provided an opportunity to ask questions and all were answered. The patient agreed with the plan and demonstrated an understanding of the instructions.   The patient was advised to call back or seek an in-person evaluation if the symptoms worsen or if the condition fails to improve as anticipated.  I provided 30 minutes of non-face-to-face time during this encounter.   Cleotis Nipper, MD

## 2020-04-14 ENCOUNTER — Telehealth (HOSPITAL_COMMUNITY): Payer: Self-pay | Admitting: Licensed Clinical Social Worker

## 2020-04-14 ENCOUNTER — Ambulatory Visit (HOSPITAL_COMMUNITY): Payer: 59 | Admitting: Licensed Clinical Social Worker

## 2020-04-14 ENCOUNTER — Other Ambulatory Visit: Payer: Self-pay

## 2020-04-14 ENCOUNTER — Other Ambulatory Visit (HOSPITAL_COMMUNITY): Payer: Self-pay | Admitting: Psychiatry

## 2020-04-14 DIAGNOSIS — F39 Unspecified mood [affective] disorder: Secondary | ICD-10-CM

## 2020-04-14 NOTE — Telephone Encounter (Signed)
Pt called the front desk and asked cln call her back. Cln returned call and explained that I can not see her due to her change of insurance [employer], but would reach out to front desk to have her transferred to another therapist. Pt was ok with this.  Ottis Stain, LCAS

## 2020-04-14 NOTE — Telephone Encounter (Signed)
Pt did not present for virtual video therapy session after link was emailed. Cln called pt and left msg on vm that her session began at 10, please join. She did not join. Jasaun Carn, LCAS

## 2020-04-20 ENCOUNTER — Other Ambulatory Visit: Payer: Self-pay

## 2020-04-20 ENCOUNTER — Encounter (HOSPITAL_BASED_OUTPATIENT_CLINIC_OR_DEPARTMENT_OTHER): Payer: Self-pay

## 2020-04-20 ENCOUNTER — Emergency Department (HOSPITAL_BASED_OUTPATIENT_CLINIC_OR_DEPARTMENT_OTHER)
Admission: EM | Admit: 2020-04-20 | Discharge: 2020-04-20 | Disposition: A | Discharge status: Home / Self Care | Payer: 59 | Attending: Emergency Medicine | Admitting: Emergency Medicine

## 2020-04-20 DIAGNOSIS — R55 Syncope and collapse: Secondary | ICD-10-CM | POA: Diagnosis present

## 2020-04-20 DIAGNOSIS — R531 Weakness: Secondary | ICD-10-CM | POA: Insufficient documentation

## 2020-04-20 DIAGNOSIS — Z8616 Personal history of COVID-19: Secondary | ICD-10-CM | POA: Insufficient documentation

## 2020-04-20 DIAGNOSIS — R110 Nausea: Secondary | ICD-10-CM | POA: Insufficient documentation

## 2020-04-20 HISTORY — DX: Iron deficiency: E61.1

## 2020-04-20 LAB — CBC WITH DIFFERENTIAL/PLATELET
Abs Immature Granulocytes: 0.01 10*3/uL (ref 0.00–0.07)
Basophils Absolute: 0 10*3/uL (ref 0.0–0.1)
Basophils Relative: 0 %
Eosinophils Absolute: 0 10*3/uL (ref 0.0–0.5)
Eosinophils Relative: 1 %
HCT: 38.8 % (ref 36.0–46.0)
Hemoglobin: 12.8 g/dL (ref 12.0–15.0)
Immature Granulocytes: 0 %
Lymphocytes Relative: 24 %
Lymphs Abs: 1.4 10*3/uL (ref 0.7–4.0)
MCH: 29.1 pg (ref 26.0–34.0)
MCHC: 33 g/dL (ref 30.0–36.0)
MCV: 88.2 fL (ref 80.0–100.0)
Monocytes Absolute: 0.3 10*3/uL (ref 0.1–1.0)
Monocytes Relative: 5 %
Neutro Abs: 3.9 10*3/uL (ref 1.7–7.7)
Neutrophils Relative %: 70 %
Platelets: 198 10*3/uL (ref 150–400)
RBC: 4.4 MIL/uL (ref 3.87–5.11)
RDW: 11.8 % (ref 11.5–15.5)
WBC: 5.6 10*3/uL (ref 4.0–10.5)
nRBC: 0 % (ref 0.0–0.2)

## 2020-04-20 LAB — URINALYSIS, ROUTINE W REFLEX MICROSCOPIC
Bilirubin Urine: NEGATIVE
Glucose, UA: NEGATIVE mg/dL
Ketones, ur: NEGATIVE mg/dL
Leukocytes,Ua: NEGATIVE
Nitrite: NEGATIVE
Protein, ur: NEGATIVE mg/dL
Specific Gravity, Urine: 1.01 (ref 1.005–1.030)
pH: 7 (ref 5.0–8.0)

## 2020-04-20 LAB — COMPREHENSIVE METABOLIC PANEL
ALT: 19 U/L (ref 0–44)
AST: 23 U/L (ref 15–41)
Albumin: 4.7 g/dL (ref 3.5–5.0)
Alkaline Phosphatase: 57 U/L (ref 38–126)
Anion gap: 8 (ref 5–15)
BUN: 14 mg/dL (ref 6–20)
CO2: 25 mmol/L (ref 22–32)
Calcium: 9.3 mg/dL (ref 8.9–10.3)
Chloride: 101 mmol/L (ref 98–111)
Creatinine, Ser: 0.83 mg/dL (ref 0.44–1.00)
GFR, Estimated: >60 mL/min (ref >60)
Glucose, Bld: 124 mg/dL — ABNORMAL HIGH (ref 70–99)
Potassium: 3.7 mmol/L (ref 3.5–5.1)
Sodium: 134 mmol/L — ABNORMAL LOW (ref 135–145)
Total Bilirubin: 0.3 mg/dL (ref 0.3–1.2)
Total Protein: 8.1 g/dL (ref 6.5–8.1)

## 2020-04-20 LAB — URINALYSIS, MICROSCOPIC (REFLEX)

## 2020-04-20 LAB — PREGNANCY, URINE: Preg Test, Ur: NEGATIVE

## 2020-04-20 MED ORDER — METOCLOPRAMIDE HCL 5 MG/ML IJ SOLN
10.0000 mg | Freq: Once | INTRAMUSCULAR | Status: AC
  Administered 2020-04-20: 10 mg via INTRAVENOUS
  Filled 2020-04-20: qty 2

## 2020-04-20 MED ORDER — FAMOTIDINE IN NACL 20-0.9 MG/50ML-% IV SOLN
20.0000 mg | Freq: Once | INTRAVENOUS | Status: AC
  Administered 2020-04-20: 20 mg via INTRAVENOUS
  Filled 2020-04-20: qty 50

## 2020-04-20 MED ORDER — METOCLOPRAMIDE HCL 5 MG PO TABS: 5.0000 mg | ORAL_TABLET | Freq: Four times a day (QID) | ORAL | 0 refills | Status: AC

## 2020-04-20 MED ORDER — SODIUM CHLORIDE 0.9 % IV BOLUS
1000.0000 mL | Freq: Once | INTRAVENOUS | Status: AC
  Administered 2020-04-20: 1000 mL via INTRAVENOUS

## 2020-04-20 NOTE — ED Notes (Signed)
Patient verbalizes understanding of discharge instructions. Opportunity for questioning and answers were provided. Armband removed by staff, pt discharged from ED ambulatory to home.  

## 2020-04-20 NOTE — ED Provider Notes (Signed)
[facility] EMERGENCY DEPARTMENT Provider Note   CSN: [id number] Arrival date & time: 04/20/20  1731     History Chief Complaint  Patient presents with  . Near Syncope    Kayla Murphy is a 30 y.o. female.  HPI   30 year old female who is known Covid positive at a week ago presents the emergency department with weakness. Patient states earlier today while she was at work she was having hot and cold flashes, she felt faint and generally weak. This all self resolved. She never had any chest pain, shortness of breath, syncope. Patient has had decreased p.o. intake and mild nausea but denies any vomiting, abdominal pain, diarrhea. She thought earlier that the whites of her eyes were slightly yellow. Denies any swelling of her lower extremities.  Past Medical History:  Diagnosis Date  . Anxiety   . Depression   . 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 Low iron   . Medical history non-contributory     Patient Active Problem List   Diagnosis Date Noted  . Cannabis use disorder, mild, abuse 06/25/2019  . Alcohol use disorder, mild, abuse 06/25/2019  . Generalized anxiety disorder 06/25/2019  . Major depressive disorder, recurrent episode, severe (HCC) 12/26/2018    Past Surgical History:  Procedure Laterality Date  . NO PAST SURGERIES       OB History    Gravida  3   Para  0   Term  0   Preterm  0   AB  2   Living  0     SAB  1   IAB  1   Ectopic  0   Multiple  0   Live Births              Family History  Problem Relation Age of Onset  . Depression Mother     Social History   Tobacco Use  . Smoking status: Never Smoker  . Smokeless tobacco: Never Used  Vaping Use  . Vaping Use: Never used  Substance Use Topics  . Alcohol use: Yes    Comment: occ  . Drug use: No    Home Medications Prior to Admission medications   Medication Sig Start Date End Date Taking? Authorizing Provider  acetaminophen (TYLENOL) 500 MG tablet Take 500 mg by  mouth every 6 (six) hours as needed for mild pain or fever.    [provider]  ARIPiprazole (ABILIFY) 5 MG tablet Take 1 tablet (5 mg total) by mouth daily. Patient not taking: Reported on 04/01/2020 02/11/20 02/10/21  Arfeen, Phillips Grout, MD  buPROPion (WELLBUTRIN XL) 150 MG 24 hr tablet Take 1 tablet (150 mg total) by mouth every morning. 04/01/20   Arfeen, Phillips Grout, MD  EPINEPHrine (EPIPEN 2-PAK) 0.3 mg/0.3 mL IJ SOAJ injection Inject 0.3 mg into the muscle once.    [provider]  ferrous sulfate 325 (65 FE) MG tablet Take 325 mg by mouth every morning. 12/19/18   [provider]  lamoTRIgine (LAMICTAL) 100 MG tablet Take 1 tablet (100 mg total) by mouth daily. 04/01/20   Arfeen, Phillips Grout, MD  levonorgestrel (MIRENA, 52 MG,) 20 MCG/24HR IUD by Intrauterine route.    [provider]  traZODone (DESYREL) 50 MG tablet Take 1 tablet (50 mg total) by mouth at bedtime. Patient not taking: No sig reported 01/01/19   Oneta Rack, NP  valACYclovir (VALTREX) 1000 MG tablet Take 1,000 mg by mouth as needed. 04/07/18  [provider]    Allergies    Shellfish allergy  Review of Systems   Review of Systems  Constitutional: Positive for appetite change, chills and fatigue. Negative for fever.  HENT: Negative for congestion.   Eyes: Negative for visual disturbance.  Respiratory: Negative for shortness of breath.   Cardiovascular: Negative for chest pain.  Gastrointestinal: Positive for nausea. Negative for abdominal pain, diarrhea and vomiting.  Genitourinary: Negative for dysuria.  Skin: Negative for rash.  Neurological: Negative for headaches.    Physical Exam Updated Vital Signs BP 131/76 (BP Location: Left Arm)   Pulse 76   Temp 97.7 F (36.5 C) (Oral)   Resp 18   Ht 5\' 5"  (1.651 m)   Wt 76.2 kg   LMP 04/19/2020   SpO2 100%   BMI 27.96 kg/m   Physical Exam Vitals and nursing note reviewed.  Constitutional:      Appearance: Normal  appearance.  HENT:     Head: Normocephalic.     Mouth/Throat:     Mouth: Mucous membranes are moist.  Cardiovascular:     Rate and Rhythm: Normal rate.  Pulmonary:     Effort: Pulmonary effort is normal. No respiratory distress.  Abdominal:     Palpations: Abdomen is soft.     Tenderness: There is no abdominal tenderness. There is no guarding.  Musculoskeletal:        General: No swelling.  Skin:    General: Skin is warm.  Neurological:     Mental Status: She is alert and oriented to person, place, and time. Mental status is at baseline.  Psychiatric:        Mood and Affect: Mood normal.     ED Results / Procedures / Treatments   Labs (all labs ordered are listed, but only abnormal results are displayed) Labs Reviewed  COMPREHENSIVE METABOLIC PANEL - Abnormal; Notable for the following components:      Result Value   Sodium 134 (*)    Glucose, Bld 124 (*)    All other components within normal limits  URINALYSIS, ROUTINE W REFLEX MICROSCOPIC - Abnormal; Notable for the following components:   Hgb urine dipstick SMALL (*)    All other components within normal limits  URINALYSIS, MICROSCOPIC (REFLEX) - Abnormal; Notable for the following components:   Bacteria, UA RARE (*)    All other components within normal limits  CBC WITH DIFFERENTIAL/PLATELET  PREGNANCY, URINE    EKG None  Radiology No results found.  Procedures Procedures   Medications Ordered in ED Medications  famotidine (PEPCID) IVPB 20 mg premix (20 mg Intravenous New Bag/Given 04/20/20 2231)  sodium chloride 0.9 % bolus 1,000 mL (1,000 mLs Intravenous New Bag/Given 04/20/20 2210)  metoCLOPramide (REGLAN) injection 10 mg (10 mg Intravenous Given 04/20/20 2211)    ED Course  I have reviewed the triage vital signs and the nursing notes.  Pertinent labs & imaging results that were available during my care of the patient were reviewed by me and considered in my medical decision making (see chart for  details).    MDM Rules/Calculators/A&P                          30 year old female presents the emergency department with weakness and episode of feeling faint.  No syncope.  She has been having episodes of nausea without any vomiting or diarrhea.  Vital signs are normal on arrival.  No chest pain or shortness of breath.  She appears nontoxic.  She has dry mucous membranes.  Blood work is reassuring without any acute findings, patient's nausea was treated, she was hydrated.  She passed her p.o. test.  Do not feel any further emergent work-up is warranted.  She is PERC negative.  Will be sent home for rehydration as an outpatient.  Patient will be discharged and treated as an outpatient.  Discharge plan and strict return to ED precautions discussed, patient verbalizes understanding and agreement.  Final Clinical Impression(s) / ED Diagnoses Final diagnoses:  None    Rx / DC Orders ED Discharge Orders    None       Rozelle Logan, DO 04/20/20 2343

## 2020-04-20 NOTE — ED Notes (Signed)
ED Provider at bedside. 

## 2020-04-20 NOTE — ED Notes (Signed)
PA Provider at bedside. 

## 2020-04-20 NOTE — Discharge Instructions (Signed)
You have been seen and discharged from the emergency department.  Follow-up with your primary provider for reevaluation. Take new prescriptions and home medications as prescribed. If you have any worsening symptoms or further concerns for health please return to an emergency department for further evaluation. 

## 2020-04-20 NOTE — ED Triage Notes (Addendum)
Pt c/o "feeling faint" started this am-states feels same when iron levels low-also feels her "eyes are yellow"-pt states she was +covid 1/19 with neg test yesterday-NAD-steady gait

## 2020-04-20 NOTE — ED Notes (Signed)
Returned back to work today after [employer] intine for 10 days secondary to [employer] D[employer]. Has felt weak all day. Has not had much to eat today, nausea began approx 1500hrs. Having intermittent "hot flashes"

## 2020-04-20 NOTE — ED Notes (Signed)
Pt placed in Exam Room 5, gait very steady, ambulated to restroom without assistance, appears in no immediate distress at this time.

## 2020-04-22 ENCOUNTER — Other Ambulatory Visit: Payer: Self-pay

## 2020-04-22 ENCOUNTER — Ambulatory Visit (INDEPENDENT_AMBULATORY_CARE_PROVIDER_SITE_OTHER): Payer: 59 | Admitting: Clinical

## 2020-04-22 DIAGNOSIS — F1210 Cannabis abuse, uncomplicated: Secondary | ICD-10-CM

## 2020-04-22 DIAGNOSIS — F419 Anxiety disorder, unspecified: Secondary | ICD-10-CM | POA: Diagnosis not present

## 2020-04-22 DIAGNOSIS — F39 Unspecified mood [affective] disorder: Secondary | ICD-10-CM | POA: Diagnosis not present
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
## 2020-04-28 ENCOUNTER — Ambulatory Visit (HOSPITAL_COMMUNITY): Payer: 59 | Admitting: Licensed Clinical Social Worker

## 2020-04-29 ENCOUNTER — Telehealth (INDEPENDENT_AMBULATORY_CARE_PROVIDER_SITE_OTHER): Payer: 59 | Admitting: Psychiatry

## 2020-04-29 ENCOUNTER — Other Ambulatory Visit: Payer: Self-pay

## 2020-04-29 ENCOUNTER — Encounter (HOSPITAL_COMMUNITY): Payer: Self-pay | Admitting: Psychiatry

## 2020-04-29 DIAGNOSIS — F39 Unspecified mood [affective] disorder: Secondary | ICD-10-CM | POA: Diagnosis not present

## 2020-04-29 DIAGNOSIS — F419 Anxiety disorder, unspecified: Secondary | ICD-10-CM

## 2020-04-29 MED ORDER — HYDROXYZINE PAMOATE 25 MG PO CAPS
25.0000 mg | ORAL_CAPSULE | Freq: Every evening | ORAL | 0 refills | Status: DC | PRN
Start: 2020-04-29 — End: 2020-05-22

## 2020-04-29 MED ORDER — LAMOTRIGINE 150 MG PO TABS: 150.0000 mg | ORAL_TABLET | Freq: Every day | ORAL | 0 refills | Status: DC

## 2020-04-29 MED ORDER — BUPROPION HCL ER (XL) 150 MG PO TB24: 150.0000 mg | ORAL_TABLET | Freq: Every morning | ORAL | 0 refills | Status: DC

## 2020-04-29 NOTE — Progress Notes (Signed)
Virtual Visit via Telephone Note  I connected with [place] Petz on 04/29/20 at 11:40 AM EST by telephone and verified that I am speaking with the correct person using two identifiers.  Location: Patient: Work Provider: Economist   I discussed the limitations, risks, security and privacy concerns of performing an evaluation and management service by telephone and the availability of in person appointments. I also discussed with the patient that there may be a patient responsible charge related to this service. The patient expressed understanding and agreed to proceed.   History of Present Illness: Patient is evaluated by phone session.  Her mother Alfonse Ras was also on the phone.  Patient reported that she had stopped smoking marijuana and alcohol.  She is taking Lamictal regularly but sometimes she does not feel much difference.  However her mother endorsed that she is somewhat better as she is handling the stress better than before.  She does make rational decision but is still have mood swings, rage, impulsivity.  Sometimes she gets a lot of energy and can crash into depression.  She denies any crying spells, suicidal thoughts but remains irritable at sometimes.  She has rage but recently not violent or aggressive.  Her appetite is okay.  She is able to fall asleep quickly but does not stays sleep:.  Patient is a Runner, broadcasting/film/video.  Her job is going okay.  She has no tremors, shakes or any EPS.   Past Psychiatric History: H/Odepression,hi and lows,irritability and cutting in early age. H/Ostrong suicidal thoughts when tried to cut her self, drown herself, holding her breath and droveherself but not able to succeed. H/Oimpulsive buying, anger, speeding tickets. [facility] but did not do it.GivenWellbutrinbutconsistent with the compliance. Did PHP/IOP in 2020. H/O THC and ETOH.   Psychiatric Specialty Exam: Physical Exam  Review of Systems  Weight 168  lb (76.2 kg), last menstrual period 04/19/2020.There is no height or weight on file to calculate BMI.  General Appearance: NA  Eye Contact:  NA  Speech:  Clear and Coherent  Volume:  Normal  Mood:  Irritable  Affect:  NA  Thought Process:  Goal Directed  Orientation:  Full (Time, Place, and Person)  Thought Content:  Logical  Suicidal Thoughts:  No  Homicidal Thoughts:  No  Memory:  Immediate;   Good Recent;   Good Remote;   Good  Judgement:  Intact  Insight:  Present  Psychomotor Activity:  NA  Concentration:  Concentration: Fair and Attention Span: Good  Recall:  Good  Fund of Knowledge:  Good  Language:  Good  Akathisia:  No  Handed:  Right  AIMS (if indicated):     Assets:  Communication Skills Desire for Improvement Financial Resources/Insurance Housing Social Support Transportation  ADL's:  Intact  Cognition:  WNL  Sleep:   falling sleep ok      Assessment and Plan: Mood disorder, NOS.  Rule out bipolar disorder type I.  Anxiety.  Patient had stopped drinking and cannabis.  As per mother mild improvement in her mood but is still have a lot of residual mood lability, impulsivity, rage.  We discussed either trying to increase Lamictal 150 or consider Abilify but after discussion with the symptoms we agreed to give more time to her Lamictal and increase his dose to 150 mg.  I would also discontinue trazodone and try hydroxyzine to help with anxiety and sleep.  She will continue Wellbutrin XL 150 mg daily.  Patient is started  therapy with [name].  Encouraged to continue that.  Recommended to call us back if is any question or any concern.  Patient has no rash, itching tremors or shakes.  Follow-up in 4 weeks.  Follow Up Instructions:    I discussed the assessment and treatment plan with the patient. The patient was provided an opportunity to ask questions and all were answered. The patient agreed with the plan and demonstrated an understanding of the  instructions.   The patient was advised to call back or seek an in-person evaluation if the symptoms worsen or if the condition fails to improve as anticipated.  I provided 20 minutes of non-face-to-face time during this encounter.   Cleotis Nipper, MD

## 2020-05-06 ENCOUNTER — Other Ambulatory Visit: Payer: Self-pay

## 2020-05-06 ENCOUNTER — Ambulatory Visit (HOSPITAL_COMMUNITY): Payer: 59 | Admitting: Clinical

## 2020-05-12 ENCOUNTER — Ambulatory Visit (HOSPITAL_COMMUNITY): Payer: 59 | Admitting: Licensed Clinical Social Worker

## 2020-05-20 ENCOUNTER — Ambulatory Visit (INDEPENDENT_AMBULATORY_CARE_PROVIDER_SITE_OTHER): Payer: 59 | Admitting: Clinical

## 2020-05-20 ENCOUNTER — Other Ambulatory Visit: Payer: Self-pay | Admitting: Ophthalmology

## 2020-05-20 ENCOUNTER — Other Ambulatory Visit: Payer: Self-pay

## 2020-05-20 DIAGNOSIS — F419 Anxiety disorder, unspecified: Secondary | ICD-10-CM

## 2020-05-20 DIAGNOSIS — S0280XD Fracture of other specified skull and facial bones, unspecified side, subsequent encounter for fracture with routine healing: Secondary | ICD-10-CM

## 2020-05-20 DIAGNOSIS — S02842A Fracture of lateral orbital wall, left side, initial encounter for closed fracture: Secondary | ICD-10-CM

## 2020-05-20 DIAGNOSIS — F39 Unspecified mood [affective] disorder: Secondary | ICD-10-CM | POA: Diagnosis not present

## 2020-05-20 NOTE — Progress Notes (Signed)
   THERAPIST PROGRESS NOTE  Session Time: 3pm  Participation Level: Active  Behavioral Response: CasualAlertpleasant  Type of Therapy: Individual Therapy  Treatment Goals addressed: Anxiety  Interventions: Supportive and Reframing  Virtual Visit via Video Note  I connected with Kayla Murphy on 05/20/20 at  3:00 PM EST by a video enabled telemedicine application and verified that I am speaking with the correct person using two identifiers.  Location: Patient: home Provider: office   I discussed the limitations of evaluation and management by telemedicine and the availability of in person appointments. The patient expressed understanding and agreed to proceed.   I discussed the assessment and treatment plan with the patient. The patient was provided an opportunity to ask questions and all were answered. The patient agreed with the plan and demonstrated an understanding of the instructions.   The patient was advised to call back or seek an in-person evaluation if the symptoms worsen or if the condition fails to improve as anticipated.  I provided 45 minutes of non-face-to-face time during this encounter.  Summary: Kayla Murphy is a 30 y.o. female who presents in a pleasant mood. Pt did not identify treatment goals, treatment plan to be completed during next visit. Pt discussed having a tendency to internalize negative comments/events. 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 Pt acknowledges that her thinking is irrational but says she has difficulty challenging automatic negative thoughts.  Suicidal/Homicidal: Pt denies SI/HI no plan to harm self or others reported.  Therapist Response: CSW assessed for changes in mood, behavior and daily functioning. CSW discussed with pt what reframing is and modeled how to reframe negative thinking and replace with positive alternatives. CSW assisted pt in identifying areas of a  situation/conversation that she does and does not control. CSW processed with pt how core beliefs can influence thoughts, behaviors and feelings.  Plan: Return again in 2 weeks.  Diagnosis: Axis I: Mood disorder    Anxiety    Axis II: No diagnosis    Suzan Slick, LCSW 05/20/2020

## 2020-05-22 ENCOUNTER — Encounter (HOSPITAL_COMMUNITY): Payer: Self-pay | Admitting: Psychiatry

## 2020-05-22 ENCOUNTER — Telehealth (INDEPENDENT_AMBULATORY_CARE_PROVIDER_SITE_OTHER): Payer: 59 | Admitting: Psychiatry

## 2020-05-22 ENCOUNTER — Other Ambulatory Visit: Payer: Self-pay

## 2020-05-22 VITALS — Wt 168.0 lb

## 2020-05-22 DIAGNOSIS — F319 Bipolar disorder, unspecified: Secondary | ICD-10-CM

## 2020-05-22 DIAGNOSIS — F419 Anxiety disorder, unspecified: Secondary | ICD-10-CM | POA: Diagnosis not present

## 2020-05-22 MED ORDER — HYDROXYZINE PAMOATE 25 MG PO CAPS: 25.0000 mg | ORAL_CAPSULE | Freq: Every evening | ORAL | 0 refills | Status: AC | PRN

## 2020-05-22 MED ORDER — BUPROPION HCL ER (XL) 150 MG PO TB24
150.0000 mg | ORAL_TABLET | Freq: Every morning | ORAL | 1 refills | Status: AC
Start: 2020-05-22 — End: ?

## 2020-05-22 MED ORDER — LAMOTRIGINE 200 MG PO TABS: 200.0000 mg | ORAL_TABLET | Freq: Every day | ORAL | 1 refills | Status: AC

## 2020-05-22 NOTE — Progress Notes (Signed)
Virtual Visit via Telephone Note  I connected with Kayla Murphy on 05/22/20 at  8:20 AM EST by telephone and verified that I am speaking with the correct person using two identifiers.  Location: Patient: Work Provider: Home office   I discussed the limitations, risks, security and privacy concerns of performing an evaluation and management service by telephone and the availability of in person appointments. I also discussed with the patient that there may be a patient responsible charge related to this service. The patient expressed understanding and agreed to proceed.   History of Present Illness: Patient is evaluated by phone session.  She was last evaluated a month ago.  She was having a lot of irritability, mood swings, anger and rage.  We increased the Lamictal to 150 mg and added low-dose hydroxyzine.  She noticed her sleep is better and she is not having a lot of impulsive and anger or rage but is still irritability and frustration.  She also started therapy with Kayla Murphy and that is helping as she is getting insight into her illness.  She denies any recent rage or any violent behavior.  She is tolerating the Lamictal and reported no tremors, shakes, rash or any itching.  She denies any hallucination, paranoia or any homicidal thoughts.  Her appetite is okay.  Her weight is unchanged from the past.  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  She is looking forward to have a spring break in April.  She lives with her mother who is supportive.  Patient feels proud of since January she had not smoked marijuana or use alcohol.  Past Psychiatric History: H/Odepression,hi and lows,irritability and cutting in early age. H/Ostrong suicidal thoughts when tried to cut her self, drown herself, holding her breath and droveherself but not able to succeed. H/Oimpulsive buying, anger, speeding tickets. [facility] but did not do  it.GivenWellbutrinbutconsistent with the compliance. Did PHP/IOP in 2020. H/O THC and ETOH.   Psychiatric Specialty Exam: Physical Exam  Review of Systems  Weight 168 lb (76.2 kg).There is no height or weight on file to calculate BMI.  General Appearance: NA  Eye Contact:  NA  Speech:  Clear and Coherent  Volume:  Normal  Mood:  Irritable  Affect:  NA  Thought Process:  Goal Directed  Orientation:  Full (Time, Place, and Person)  Thought Content:  Logical  Suicidal Thoughts:  No  Homicidal Thoughts:  No  Memory:  Immediate;   Good Recent;   Good Remote;   Good  Judgement:  Intact  Insight:  Present  Psychomotor Activity:  NA  Concentration:  Concentration: Good and Attention Span: Good  Recall:  Good  Fund of Knowledge:  Good  Language:  Good  Akathisia:  No  Handed:  Right  AIMS (if indicated):     Assets:  Communication Skills Desire for Improvement Housing Resilience Social Support Talents/Skills Transportation  ADL's:  Intact  Cognition:  WNL  Sleep:   better      Assessment and Plan: Bipolar disorder, depressed type.  Anxiety.  Patient tolerating Lamictal 150 mg and she has noticed improvement in her anger, rage and severe mood swings but is still have irritability and frustration.  Her sleep is better when she takes the hydroxyzine.  She also started therapy which is working well.  I recommend she can try Lamictal 200 mg to help her mood lability.  Continue hydroxyzine 25 mg to take as needed for sleep  and anxiety and continue Wellbutrin XL 150 mg daily.  Patient wants to give more time to the Lamictal before she changing to a different medication.  I also agree if she feels that Lamictal 150 mg working before her next refill to pick up 200 mg when she can call us and we can switch back to 150 Lamictal.  Encouraged to continue therapy with Kayla Murphy.  Recommended to call us back if is any question or any concern.  Follow-up in 6 weeks.  Follow Up  Instructions:    I discussed the assessment and treatment plan with the patient. The patient was provided an opportunity to ask questions and all were answered. The patient agreed with the plan and demonstrated an understanding of the instructions.   The patient was advised to call back or seek an in-person evaluation if the symptoms worsen or if the condition fails to improve as anticipated.  I provided 16 minutes of non-face-to-face time during this encounter.   Cleotis Nipper, MD

## 2020-05-26 ENCOUNTER — Ambulatory Visit (HOSPITAL_COMMUNITY): Payer: 59 | Admitting: Licensed Clinical Social Worker

## 2020-05-30 ENCOUNTER — Inpatient Hospital Stay: Admission: RE | Admit: 2020-05-30 | Payer: 59 | Source: Ambulatory Visit

## 2020-06-03 ENCOUNTER — Ambulatory Visit (HOSPITAL_COMMUNITY): Payer: 59 | Admitting: Clinical

## 2020-06-03 ENCOUNTER — Other Ambulatory Visit: Payer: Self-pay

## 2020-06-09 ENCOUNTER — Ambulatory Visit (HOSPITAL_COMMUNITY): Payer: 59 | Admitting: Licensed Clinical Social Worker

## 2020-06-17 ENCOUNTER — Ambulatory Visit (HOSPITAL_COMMUNITY): Payer: 59 | Admitting: Clinical

## 2020-06-17 ENCOUNTER — Other Ambulatory Visit: Payer: Self-pay

## 2020-06-23 ENCOUNTER — Ambulatory Visit (HOSPITAL_COMMUNITY): Payer: 59 | Admitting: Licensed Clinical Social Worker

## 2020-07-07 ENCOUNTER — Telehealth (HOSPITAL_COMMUNITY): Payer: Self-pay | Admitting: Licensed Clinical Social Worker

## 2020-07-07 ENCOUNTER — Telehealth (INDEPENDENT_AMBULATORY_CARE_PROVIDER_SITE_OTHER): Payer: 59 | Admitting: Psychiatry

## 2020-07-07 ENCOUNTER — Encounter (HOSPITAL_COMMUNITY): Payer: Self-pay | Admitting: Psychiatry

## 2020-07-07 ENCOUNTER — Other Ambulatory Visit: Payer: Self-pay

## 2020-07-07 VITALS — Wt 171.0 lb

## 2020-07-07 DIAGNOSIS — F319 Bipolar disorder, unspecified: Secondary | ICD-10-CM | POA: Diagnosis not present

## 2020-07-07 DIAGNOSIS — F419 Anxiety disorder, unspecified: Secondary | ICD-10-CM | POA: Diagnosis not present

## 2020-07-07 MED ORDER — ARIPIPRAZOLE 5 MG PO TABS: 5.0000 mg | ORAL_TABLET | Freq: Every day | ORAL | 0 refills | Status: AC

## 2020-07-07 NOTE — Progress Notes (Signed)
Virtual Visit via Telephone Note  I connected with Kayla Murphy on 07/07/20 at  9:20 AM EDT by telephone and verified that I am speaking with the correct person using two identifiers.  Location: Patient: Home Provider: Home Office   I discussed the limitations, risks, security and privacy concerns of performing an evaluation and management service by telephone and the availability of in person appointments. I also discussed with the patient that there may be a patient responsible charge related to this service. The patient expressed understanding and agreed to proceed.   History of Present Illness: Patient is evaluated by phone session.  She did not increase the Lamictal dose which was recommended on the last visit.  She actually stopped taking all her medication which is Lamictal, Wellbutrin and hydroxyzine.  She admitted getting frustrated because she was feeling the same and last week she was so upset that she wanted to quit the job.  She admitted missing the sessions with therapist twice because of a conflict of work schedule.  She endorsed feeling sad, depressed, lack of motivation to do things.  She admitted not as active and gain 3 to 4 pounds in 1 month.  Though she denies any rage or any suicidal thoughts or any self abusive behavior but endorses lack of motivation, desire to do things.  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  She lives by herself but she has a good support from her mother and friends.  She admitted her job is stressful and sometimes she does not like her job.  Patient has history of noncompliance with medications and follow-ups.  Patient denies any paranoia, hallucination, severe mood swings.  She sleeps on and off and sometimes she has a lot of racing thoughts.  She do not report smoking marijuana or drinking alcohol.  Her PHQ score is 7.  Past Psychiatric History: H/Odepression,hi and lows,irritability and  cutting in early age. H/Ostrong suicidal thoughts when tried to cut her self, drown herself, holding her breath and droveherself but not able to succeed. H/Oimpulsive buying, anger, speeding tickets. [facility] but did not do it.GivenWellbutrinbutconsistent with the compliance. Did PHP/IOP in 2020. H/O THC and ETOH.   Psychiatric Specialty Exam: Physical Exam  Review of Systems  Weight 171 lb (77.6 kg).There is no height or weight on file to calculate BMI.  General Appearance: NA  Eye Contact:  NA  Speech:  Slow  Volume:  Decreased  Mood:  Depressed and Dysphoric  Affect:  NA  Thought Process:  Goal Directed  Orientation:  Full (Time, Place, and Person)  Thought Content:  Rumination  Suicidal Thoughts:  No  Homicidal Thoughts:  No  Memory:  Immediate;   Good Recent;   Good Remote;   Good  Judgement:  Fair  Insight:  Shallow  Psychomotor Activity:  NA  Concentration:  Concentration: Fair and Attention Span: Good  Recall:  Good  Fund of Knowledge:  Good  Language:  Good  Akathisia:  No  Handed:  Right  AIMS (if indicated):     Assets:  Communication Skills Desire for Improvement Housing Social Support Talents/Skills  ADL's:  Intact  Cognition:  WNL  Sleep:   fair      Assessment and Plan: Bipolar disorder, depressed type.  Anxiety.  Patient has a history of mood swings, mania and rage with speeding tickets and impulsive buying.  She has history  of inconsistent with medication, follow-ups.  Now she had stopped taking Wellbutrin, Lamictal.  After some discussion she agreed to reconsider Abilify which was recommended in the past the patient at that time could not afford it.  Now patient has a different insurance that she is willing to try the Abilify.  I also recommend she should consider group therapy as patient in the past did PHP/IOP with good outcome.  We discussed safety concerns at any time having active suicidal  thoughts or homicidal thoughts and she need to call 911 or go to local emergency room.  We will start Abilify 5 mg p.o. daily she will take half tablet.  We will also refer her to group therapy.  Follow up in 4 weeks.  Follow Up Instructions:    I discussed the assessment and treatment plan with the patient. The patient was provided an opportunity to ask questions and all were answered. The patient agreed with the plan and demonstrated an understanding of the instructions.   The patient was advised to call back or seek an in-person evaluation if the symptoms worsen or if the condition fails to improve as anticipated.  I provided 29 minutes of non-face-to-face time during this encounter.   Cleotis Nipper, MD

## 2020-07-09 ENCOUNTER — Telehealth (HOSPITAL_COMMUNITY): Payer: Self-pay | Admitting: Licensed Clinical Social Worker

## 2020-12-10 ENCOUNTER — Telehealth (HOSPITAL_COMMUNITY): Payer: Self-pay | Admitting: Psychiatry

## 2020-12-10 NOTE — Telephone Encounter (Signed)
D:  A concerned friend of pts called to voice that patient isn't doing well.  "She is more depressed and isn't listening to me whenever I tell her that she needs help."  Friend is requesting that someone call and talk to pt.  A:  Placed call to check on pt.  Pt admits that she isn't doing well.  Denies SI/HI or A/V hallucinations.  "I know I haven't f/u with anyone (Dr. Lolly Mustache nor Darren, LCSW); but I got all those bills and just decided that I couldn't afford it.  I do need Ms. Boneta Lucks again though."  Pt states she would like to return to [facility].  Case manager will inform [name], LCSW that pt would like to return to [facility].  R:  Pt receptive.

## 2021-04-15 ENCOUNTER — Telehealth (HOSPITAL_COMMUNITY): Payer: Self-pay | Admitting: *Deleted

## 2021-04-15 NOTE — Telephone Encounter (Signed)
Writer spoke with pt regarding whether she wanted to make an appointment with Dr. Lolly Mustache since last visit was 07/07/20, or close her out. Pt stated that she would like an appointment however she would call office "on my time".
